# Patient Record
Sex: Male | Born: 1955 | Race: Asian | Hispanic: No | Marital: Single | Smoking: Former smoker
Health system: Southern US, Community
[De-identification: ages and names within clinical notes are randomized; demographics above are authoritative.]

## PROBLEM LIST (undated history)

## (undated) DIAGNOSIS — B181 Chronic viral hepatitis B without delta-agent: Secondary | ICD-10-CM

## (undated) HISTORY — DX: Chronic viral hepatitis B without delta-agent: B18.1

---

## 2005-07-19 ENCOUNTER — Encounter: Admission: RE | Admit: 2005-07-19 | Discharge: 2005-07-19 | Payer: Self-pay | Admitting: Neurological Surgery

## 2005-08-23 ENCOUNTER — Encounter: Admission: RE | Admit: 2005-08-23 | Discharge: 2005-08-23 | Payer: Self-pay | Admitting: Neurological Surgery

## 2008-04-24 ENCOUNTER — Ambulatory Visit: Payer: Self-pay | Admitting: Internal Medicine

## 2008-08-05 ENCOUNTER — Ambulatory Visit: Payer: Self-pay | Admitting: Internal Medicine

## 2009-07-25 HISTORY — PX: LUMBAR DISC SURGERY: SHX700

## 2009-07-25 HISTORY — PX: SPINE SURGERY: SHX786

## 2010-03-05 ENCOUNTER — Ambulatory Visit: Payer: Self-pay | Admitting: Internal Medicine

## 2010-08-06 ENCOUNTER — Ambulatory Visit
Admission: RE | Admit: 2010-08-06 | Discharge: 2010-08-06 | Payer: Self-pay | Source: Home / Self Care | Attending: Internal Medicine | Admitting: Internal Medicine

## 2010-11-02 ENCOUNTER — Ambulatory Visit (INDEPENDENT_AMBULATORY_CARE_PROVIDER_SITE_OTHER): Payer: BC Managed Care – PPO | Admitting: Internal Medicine

## 2010-11-02 DIAGNOSIS — J069 Acute upper respiratory infection, unspecified: Secondary | ICD-10-CM

## 2011-05-27 ENCOUNTER — Ambulatory Visit (INDEPENDENT_AMBULATORY_CARE_PROVIDER_SITE_OTHER): Payer: BC Managed Care – PPO | Admitting: Internal Medicine

## 2011-05-27 DIAGNOSIS — Z23 Encounter for immunization: Secondary | ICD-10-CM

## 2011-09-12 ENCOUNTER — Ambulatory Visit (INDEPENDENT_AMBULATORY_CARE_PROVIDER_SITE_OTHER): Payer: BC Managed Care – PPO | Admitting: Internal Medicine

## 2011-09-12 ENCOUNTER — Encounter: Payer: Self-pay | Admitting: Internal Medicine

## 2011-09-12 DIAGNOSIS — Z8619 Personal history of other infectious and parasitic diseases: Secondary | ICD-10-CM | POA: Insufficient documentation

## 2011-09-12 DIAGNOSIS — M549 Dorsalgia, unspecified: Secondary | ICD-10-CM

## 2011-09-12 NOTE — Patient Instructions (Signed)
Take Zithromax Z-Pak 2 tablets day one followed by 1 tablet days 2 through 5. Call if not better in one week. Schedule physical exam summer 2013

## 2011-09-12 NOTE — Progress Notes (Signed)
  Subjective:    Patient ID: Sergio Lawson, male    DOB: 19-Aug-1955, 56 y.o.   MRN: [id number]  HPI 56 year old [place] man  disabled due to back pain. 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 In 2006 had right leg radiculopathy. Subsequently was found to have a large ruptured disc at L4-L5. Underwent surgery in late 2006 for that. Subsequently had nerve root LOC right L5 in 2007. Was unable to return to work due to persistent pain.  History of chronic hepatitis B 1992 seen by Dr. Madilyn Fireman. Liver biopsy performed by Dr. Madilyn Fireman 1992. Pathology showed mild chronic active hepatitis. Liver enzymes improved. Dr. Madilyn Fireman thought possibly there was a component of alcoholic liver disease the patient denied that he drank alcohol. Out for interferon was offered but decision made not to use it. In 2006 he continued to have a hepatitis B core antibody which was positive, surface antibody negative, positive surface antigen. Liver functions were normal at that time. He's not had a physical exam since then.  In today with URI symptoms. Wife has had similar illness. Denies fever or chills. Some cough. Mostly runny nose and upper respiratory congestion.    Review of Systems     Objective:   Physical Exam HEENT exam: TMs and pharynx are clear, pharynx is slightly injected, neck is supple without significant adenopathy, chest clear        Assessment & Plan:  URI  Plan: Zithromax Z-Pak take 2 tablets by mouth day one followed by 1 tablet by mouth days 2 through 5  Just patient have physical examination in the next few months and address whether or not he continues to have chronic hepatitis B.

## 2015-05-01 ENCOUNTER — Ambulatory Visit (INDEPENDENT_AMBULATORY_CARE_PROVIDER_SITE_OTHER): Payer: No Typology Code available for payment source | Admitting: Internal Medicine

## 2015-05-01 VITALS — BP 120/82 | Temp 98.0°F | Ht 65.0 in

## 2015-05-01 DIAGNOSIS — Z23 Encounter for immunization: Secondary | ICD-10-CM | POA: Diagnosis not present

## 2015-07-28 ENCOUNTER — Ambulatory Visit (INDEPENDENT_AMBULATORY_CARE_PROVIDER_SITE_OTHER): Payer: No Typology Code available for payment source | Admitting: Internal Medicine

## 2015-07-28 ENCOUNTER — Encounter: Payer: Self-pay | Admitting: Internal Medicine

## 2015-07-28 VITALS — BP 120/70 | HR 75 | Temp 97.8°F | Wt 166.0 lb

## 2015-07-28 DIAGNOSIS — J069 Acute upper respiratory infection, unspecified: Secondary | ICD-10-CM | POA: Diagnosis not present

## 2015-07-28 DIAGNOSIS — H6092 Unspecified otitis externa, left ear: Secondary | ICD-10-CM

## 2015-07-28 MED ORDER — AZITHROMYCIN 250 MG PO TABS: ORAL_TABLET | ORAL | Status: DC

## 2015-07-28 MED ORDER — BENZONATATE 100 MG PO CAPS: 200.0000 mg | ORAL_CAPSULE | Freq: Three times a day (TID) | ORAL | Status: DC

## 2015-07-28 MED ORDER — NEOMYCIN-POLYMYXIN-HC 3.5-10000-1 OT SOLN: OTIC | Status: DC

## 2015-07-28 NOTE — Patient Instructions (Signed)
Take Zithromax Z pak as directed. Cortisporin 4 drops four times daily prn itching in ear canals. Tessalon perles 200 ng 3 times daily for cough Rest and drink plenty of fluids.

## 2015-07-28 NOTE — Progress Notes (Signed)
   Subjective:    Patient ID: Sergio Lawson, male    DOB: 10/15/1955, 60 y.o.   MRN: [id number]  HPI 2 day history of URI symptoms. No fever or shaking chills. No sore throat. Has some cough and congestion. Wife has had similar illness. No recent travel history. Also complaining of some left ear irritation from time to time.    Review of Systems     Objective:   Physical Exam  Skin warm and dry. Nodes none. Pharynx very slightly injected without exudate. TMs are chronically scarred. Left external ear canal is erythematous without exudate or drainage. Neck is supple. Chest clear to auscultation without rales or wheezing.      Assessment & Plan:  Acute URI  Left otitis externa  Plan: Zithromax Z-Pak take 2 tablets day one followed by 1 tablet days 2 through 5. Cortisporin otic suspension to use 4 drops in left ear canal 4 times a day as needed for irritation. Rest and drink plenty of fluids. Tessalon Perles 200 mg 3 times daily as needed for cough

## 2016-10-06 ENCOUNTER — Other Ambulatory Visit: Payer: Self-pay | Admitting: Internal Medicine

## 2016-10-07 ENCOUNTER — Encounter: Payer: Self-pay | Admitting: Internal Medicine

## 2017-07-17 ENCOUNTER — Ambulatory Visit (INDEPENDENT_AMBULATORY_CARE_PROVIDER_SITE_OTHER): Payer: Self-pay | Admitting: Internal Medicine

## 2017-07-17 ENCOUNTER — Encounter: Payer: Self-pay | Admitting: Internal Medicine

## 2017-07-17 VITALS — BP 136/90 | HR 76 | Temp 98.9°F | Wt 160.0 lb

## 2017-07-17 DIAGNOSIS — J069 Acute upper respiratory infection, unspecified: Secondary | ICD-10-CM

## 2017-07-17 MED ORDER — AZITHROMYCIN 250 MG PO TABS: ORAL_TABLET | ORAL | 0 refills | Status: DC

## 2017-07-17 MED ORDER — BENZONATATE 100 MG PO CAPS: 100.0000 mg | ORAL_CAPSULE | Freq: Three times a day (TID) | ORAL | 0 refills | Status: DC | PRN

## 2017-07-17 NOTE — Patient Instructions (Signed)
Tessalon Perles 100 mg 3 times daily as needed for cough.  Zithromax Z-Pak take 2 tablets day 1 followed by 1 tablet days 2 through 5.  Rest and drink plenty of fluids.

## 2017-07-17 NOTE — Progress Notes (Signed)
   Subjective:    Patient ID: Sergio Lawson, male    DOB: 06/23/1956, 61 y.o.   MRN: [id number]  HPI Here today for an acute visit.  Has had URI symptoms for about a week.  Has had cough and congestion.  No fever or chills.  [facility] couple of months ago.  No myalgias.  Throat is sore.    Review of Systems history of chronic back pain and is on Disability.  Pain is midline without radiation to legs     Objective:   Physical Exam  Skin warm and dry.  Nodes none.  Pharynx is injected without exudate.  TMs are slightly full but not red.  Neck is supple.  No adenopathy.  Chest clear to auscultation without rales or wheezing.      Assessment & Plan:  Acute URI  Plan: Zithromax Z-Pak take 2 tablets day 1 followed by 1 tablet days 2 through 5.  Tessalon Perles 100 mg 3 times daily as needed for cough.  Rest and drink plenty of fluids.

## 2018-09-28 ENCOUNTER — Telehealth: Payer: Self-pay

## 2018-09-28 ENCOUNTER — Ambulatory Visit (INDEPENDENT_AMBULATORY_CARE_PROVIDER_SITE_OTHER): Payer: Self-pay | Admitting: Internal Medicine

## 2018-09-28 ENCOUNTER — Other Ambulatory Visit: Payer: Self-pay

## 2018-09-28 VITALS — BP 102/80 | HR 78 | Temp 98.5°F

## 2018-09-28 DIAGNOSIS — H9203 Otalgia, bilateral: Secondary | ICD-10-CM

## 2018-09-28 DIAGNOSIS — H6503 Acute serous otitis media, bilateral: Secondary | ICD-10-CM

## 2018-09-28 DIAGNOSIS — Z87898 Personal history of other specified conditions: Secondary | ICD-10-CM

## 2018-09-28 DIAGNOSIS — R42 Dizziness and giddiness: Secondary | ICD-10-CM

## 2018-09-28 DIAGNOSIS — R112 Nausea with vomiting, unspecified: Secondary | ICD-10-CM

## 2018-09-28 LAB — POCT INFLUENZA A/B
INFLUENZA A, POC: NEGATIVE
INFLUENZA B, POC: NEGATIVE

## 2018-09-28 MED ORDER — AZITHROMYCIN 250 MG PO TABS: ORAL_TABLET | ORAL | 0 refills | Status: DC

## 2018-09-28 MED ORDER — ONDANSETRON HCL 4 MG/2ML IJ SOLN
4.0000 mg | Freq: Once | INTRAMUSCULAR | Status: AC
Start: 2018-09-28 — End: 2018-09-28
  Administered 2018-09-28: 4 mg via INTRAMUSCULAR

## 2018-09-28 MED ORDER — MECLIZINE HCL 25 MG PO TABS: 25.0000 mg | ORAL_TABLET | Freq: Three times a day (TID) | ORAL | 0 refills | Status: DC | PRN

## 2018-09-28 NOTE — Telephone Encounter (Signed)
Patient's daughter Vamsi Biter called patient has vertigo he is vomiting and she said he experiences this once a year. She wants to know if you could work him in today?

## 2018-09-28 NOTE — Progress Notes (Signed)
   Subjective:    Patient ID: Sergio Lawson, male    DOB: 07-31-1955, 63 y.o.   MRN: [id number]  HPI 63 year old [place] Male not seen here since December 2018 in today with complaint of dizziness.  Apparently visited [place] 4 months ago.  Has remote history of vertigo a number of years ago.  Has not had flulike symptoms.  No fever or shaking chills.  He is complaining of some ear pain and had vomiting with episode of dizziness.    Review of Systems     Objective:   Physical Exam Blood pressure 102/80.  Temperature 98.5 degrees pulse oximetry 98%.  Skin warm and dry.  Nodes none.  Neck is supple.  TMs are slightly full.  Chest clear to auscultation.  Cardiac exam regular rate and rhythm.  No focal deficits on brief neurological exam. Rapid flu test is negative      Assessment & Plan:  Serous otitis media bilateral  Benign positional vertigo  Plan: Zithromax Z-PAK take 2 tablets day 1 followed by 1 tablet days 2 through 5.  Meclizine 25 mg 3 times a day as needed for dizziness and nausea.  Zofran 4 mg IM given in office for nausea.

## 2018-09-28 NOTE — Telephone Encounter (Signed)
4;30 pm

## 2018-10-20 ENCOUNTER — Encounter: Payer: Self-pay | Admitting: Internal Medicine

## 2018-10-20 DIAGNOSIS — Z87898 Personal history of other specified conditions: Secondary | ICD-10-CM | POA: Insufficient documentation

## 2018-10-20 NOTE — Patient Instructions (Addendum)
Zofran IM given in office.  Rapid flu test is negative.  Take Zithromax Z-PAK 2 tablets day 1 followed by 1 tablet days 2 through 5.  Meclizine 25 mg 3 times a day as needed for dizziness and nausea.  Call if not better in 24 to 48 hours or sooner if worse.

## 2019-05-17 ENCOUNTER — Other Ambulatory Visit: Payer: Self-pay

## 2019-05-17 DIAGNOSIS — Z20822 Contact with and (suspected) exposure to covid-19: Secondary | ICD-10-CM

## 2019-05-18 LAB — NOVEL CORONAVIRUS, NAA: SARS-CoV-2, NAA: NOT DETECTED

## 2019-05-20 ENCOUNTER — Other Ambulatory Visit: Payer: Self-pay | Admitting: *Deleted

## 2019-05-20 DIAGNOSIS — Z20822 Contact with and (suspected) exposure to covid-19: Secondary | ICD-10-CM

## 2019-05-22 LAB — NOVEL CORONAVIRUS, NAA: SARS-CoV-2, NAA: NOT DETECTED

## 2021-04-08 ENCOUNTER — Telehealth: Payer: Self-pay

## 2021-04-08 NOTE — Telephone Encounter (Signed)
Patient called complaining of cough off and on for about a month.  Advised him to do a covid test.  He says he has done this in the past.  Denies fever, sore throat.  Has not taken any medication for this.  Appointment scheduled.

## 2021-04-09 ENCOUNTER — Ambulatory Visit (INDEPENDENT_AMBULATORY_CARE_PROVIDER_SITE_OTHER): Payer: Medicare Other | Admitting: Internal Medicine

## 2021-04-09 ENCOUNTER — Ambulatory Visit
Admission: RE | Admit: 2021-04-09 | Discharge: 2021-04-09 | Disposition: A | Discharge status: Home / Self Care | Payer: Medicare Other | Source: Ambulatory Visit | Attending: Internal Medicine | Admitting: Internal Medicine

## 2021-04-09 ENCOUNTER — Other Ambulatory Visit: Payer: Self-pay

## 2021-04-09 ENCOUNTER — Encounter: Payer: Self-pay | Admitting: Internal Medicine

## 2021-04-09 VITALS — BP 152/90 | HR 75 | Ht 64.0 in | Wt 170.0 lb

## 2021-04-09 DIAGNOSIS — J22 Unspecified acute lower respiratory infection: Secondary | ICD-10-CM | POA: Diagnosis not present

## 2021-04-09 MED ORDER — BENZONATATE 100 MG PO CAPS: 100.0000 mg | ORAL_CAPSULE | Freq: Three times a day (TID) | ORAL | 0 refills | Status: DC | PRN

## 2021-04-09 MED ORDER — AMOXICILLIN 500 MG PO CAPS: 500.0000 mg | ORAL_CAPSULE | Freq: Three times a day (TID) | ORAL | 0 refills | Status: AC

## 2021-04-09 NOTE — Patient Instructions (Addendum)
Take Amoxicillin 500 mg 3 times a day for 10 days.  Tessalon Perles 3 times daily cough.  Have CXR today.  Addendum: May have lingular pneumonia.  Will need follow-up in 2 weeks.  Message left on voicemail.

## 2021-04-09 NOTE — Progress Notes (Signed)
   Subjective:    Patient ID: Sergio Lawson, male    DOB: 1955-11-04, 65 y.o.   MRN: [id number]  HPI  65 year old [place] Male seen today with protracted cough. Did travel to the beach recently with daughter and grandchild from [place]. Last seen here in 2020.   Nonsmoker.  Also, traveled with his wife to [place] earlier this Summer.  Denies fever or shaking chills.   Records indicate he has had 3 COVID vaccines- the last one being November 2021.    Review of Systems- No weight loss, night sweats. No travel history overseas     Objective:   Physical Exam BP 152/90 ,pulse 75  regular,pulse oximetry 98%, weight 170 pounds, BMI 29.18  Skin: Warm and dry.  No cervical adenopathy.  Pharynx is injected without exudate.  TMs are clear.  Neck supple.  Chest clear to auscultation without rales or wheezing.       Assessment & Plan:   Acute lower respiratory infection with abnormal CXR- ? pneumonia  Plan: Amoxicillin 500 mg 3 times a day for 10 days.  Tessalon Perles 100 mg 3 times a day as needed for cough.  Have Chest x-ray. No other CXR on file in Epic.  Addendum: Chest x-ray shows coarsened interstitial markings.  No available x-ray for comparison.  No pleural effusion but  Radiologist notes opacity overlying right heart border corresponding to the lingula --?  Lingular pneumonia.  Patient was notified of abnormal chest x-ray and recommend follow-up in 2 weeks.  Message was left on his voicemail.  We will be certain to contact him again next week as well.

## 2021-04-26 ENCOUNTER — Ambulatory Visit (INDEPENDENT_AMBULATORY_CARE_PROVIDER_SITE_OTHER): Payer: Medicare Other | Admitting: Internal Medicine

## 2021-04-26 ENCOUNTER — Encounter: Payer: Self-pay | Admitting: Internal Medicine

## 2021-04-26 ENCOUNTER — Other Ambulatory Visit: Payer: Self-pay

## 2021-04-26 ENCOUNTER — Ambulatory Visit
Admission: RE | Admit: 2021-04-26 | Discharge: 2021-04-26 | Disposition: A | Discharge status: Home / Self Care | Payer: Medicare Other | Source: Ambulatory Visit | Attending: Internal Medicine | Admitting: Internal Medicine

## 2021-04-26 VITALS — BP 142/82 | HR 68 | Temp 98.1°F | Ht 64.0 in | Wt 169.0 lb

## 2021-04-26 DIAGNOSIS — R9389 Abnormal findings on diagnostic imaging of other specified body structures: Secondary | ICD-10-CM

## 2021-04-26 DIAGNOSIS — J9811 Atelectasis: Secondary | ICD-10-CM

## 2021-04-26 DIAGNOSIS — J189 Pneumonia, unspecified organism: Secondary | ICD-10-CM

## 2021-04-26 DIAGNOSIS — R059 Cough, unspecified: Secondary | ICD-10-CM | POA: Diagnosis not present

## 2021-04-26 DIAGNOSIS — Z87891 Personal history of nicotine dependence: Secondary | ICD-10-CM

## 2021-04-26 DIAGNOSIS — R918 Other nonspecific abnormal finding of lung field: Secondary | ICD-10-CM

## 2021-04-26 MED ORDER — AZITHROMYCIN 250 MG PO TABS: ORAL_TABLET | ORAL | 0 refills | Status: AC

## 2021-04-26 NOTE — Progress Notes (Signed)
Chest xray ordered prior to appointment.

## 2021-04-26 NOTE — Patient Instructions (Signed)
Have ordered chest CT with contrast as recommended by radiology for right perihilar abnormality.  He is a former smoker.  He will take Zithromax Z-PAK 2 tabs day 1 followed by 1 tab days 2 through 5.  He has no respiratory distress and his chest is clear.  He may need pulmonary consultation given his recent travel to [place].  Lung cancer needs to be excluded.

## 2021-04-26 NOTE — Progress Notes (Signed)
Subjective:    Patient ID: Sergio Lawson, male    DOB: 1955/11/30, 65 y.o.   MRN: [id number]  HPI 65 year old Male for follow up of abnormal CXR. He says he has less cough after taking Amoxicillin. [facility] He does not have card with him today.  He was here in September for history of cough for several weeks.  He and his wife had traveled to [place] earlier this summer and subsequently to the beach with her daughter and grandchild visiting from [place].  When I saw him in September 2022, it was his first visit here since March 2020.  Patient denied weight loss, night sweats.  He is seen here infrequently.  Before his visit in September, he was last seen here in March 2020.  He would have occasional visits for URI.  There is a remote history of vertigo.  Chest x-ray was done and was abnormal showing a vague opacity overlying the right heart border corresponding to the lingula on lateral view.  He had no pleural effusion and no pneumothorax.  He had coarsened interstitial markings.  Radiologist thought there was questionable airspace disease of the lingula potentially pneumonia.  Patient was treated with Amoxicillin.  He says cough is better.  Not completely resolved.  He is now retired.  Wife is not ill.  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  In 2006 he had right leg radiculopathy and was found to have a large ruptured disc L4-L5 and underwent surgery in 2006 for that.  Dr. Danielle Dess ordered it.  He never returned to work after that due to persistent back pain and radiculopathy.  History of chronic Hepatitis B in 1992 seen by Dr. Madilyn Fireman and had liver biopsy showing mild chronic active hepatitis.  Liver enzymes improved and Dr. Madilyn Fireman thought he possibly had a component of alcoholic liver disease but the patient denied that he drank alcohol.  Interferon was offered but patient declined.  Social history: He is married.  Has 2 adult daughters.   Wife is retired but formerly worked in a Veterinary surgeon.  They were sponsored by [employer] and moved here from [place] in the 1980s.  They still have family in [place].    Review of Systems History of smoking but quit 2011. Used to smoke about a half pack a day for some 20 years.     Objective:   Physical Exam Blood pressure 142/82 pulse 68 temperature 98.1 degrees pulse oximetry 98% weight 169 pounds BMI 29.39.  He weighed 170 pounds in the last visit in mid September.  Skin: Warm and dry.  No cervical adenopathy.  No thyromegaly.  Chest is clear to auscultation without rales or wheezing.       Assessment & Plan:  Persistent cough but patient says it is better  Plan: He is to have repeat chest x-ray with further instructions to follow.  Prescription sent for Zithromax Z-PAK.  Addendum: He has ill-defined perihilar opacity in the right midlung zone with evidence of persistent right middle lobe atelectasis on the lateral projection.  Left lung is clear.  No pleural effusions.  No pneumothorax.  No pulmonary edema.  Heart size is normal.  Patient was contacted regarding persistent abnormal chest x-ray.  Radiologist is recommended CT with contrast and this will be ordered.  He will likely need Pulmonary consultation.  He was given Zithromax Z-PAK 2 tabs day 1 followed by 1 tab days 2 through  5.  Depending on what we find on CT, he may need further lab studies.

## 2021-04-27 ENCOUNTER — Other Ambulatory Visit: Payer: Medicare Other | Admitting: Internal Medicine

## 2021-04-27 DIAGNOSIS — Z1329 Encounter for screening for other suspected endocrine disorder: Secondary | ICD-10-CM

## 2021-04-27 DIAGNOSIS — Z125 Encounter for screening for malignant neoplasm of prostate: Secondary | ICD-10-CM

## 2021-04-27 DIAGNOSIS — J189 Pneumonia, unspecified organism: Secondary | ICD-10-CM

## 2021-04-27 DIAGNOSIS — R9389 Abnormal findings on diagnostic imaging of other specified body structures: Secondary | ICD-10-CM

## 2021-04-27 DIAGNOSIS — R948 Abnormal results of function studies of other organs and systems: Secondary | ICD-10-CM

## 2021-04-27 DIAGNOSIS — R700 Elevated erythrocyte sedimentation rate: Secondary | ICD-10-CM

## 2021-04-28 ENCOUNTER — Ambulatory Visit
Admission: RE | Admit: 2021-04-28 | Discharge: 2021-04-28 | Disposition: A | Discharge status: Home / Self Care | Payer: Medicare Other | Source: Ambulatory Visit | Attending: Internal Medicine | Admitting: Internal Medicine

## 2021-04-28 DIAGNOSIS — R918 Other nonspecific abnormal finding of lung field: Secondary | ICD-10-CM

## 2021-04-28 DIAGNOSIS — J9811 Atelectasis: Secondary | ICD-10-CM

## 2021-04-28 LAB — COMPLETE METABOLIC PANEL WITH GFR
AG Ratio: 1.1 (calc) (ref 1.0–2.5)
ALT: 17 U/L (ref 9–46)
AST: 41 U/L — ABNORMAL HIGH (ref 10–35)
Albumin: 4 g/dL (ref 3.6–5.1)
Alkaline phosphatase (APISO): 90 U/L (ref 35–144)
BUN: 12 mg/dL (ref 7–25)
CO2: 29 mmol/L (ref 20–32)
Calcium: 9.1 mg/dL (ref 8.6–10.3)
Chloride: 104 mmol/L (ref 98–110)
Creat: 0.88 mg/dL (ref 0.70–1.35)
Globulin: 3.8 g/dL (calc) — ABNORMAL HIGH (ref 1.9–3.7)
Glucose, Bld: 107 mg/dL (ref 65–139)
Potassium: 4.3 mmol/L (ref 3.5–5.3)
Sodium: 140 mmol/L (ref 135–146)
Total Bilirubin: 0.6 mg/dL (ref 0.2–1.2)
Total Protein: 7.8 g/dL (ref 6.1–8.1)
eGFR: 95 mL/min/{1.73_m2} (ref >=60)

## 2021-04-28 LAB — CBC WITH DIFFERENTIAL/PLATELET
Absolute Monocytes: 501 cells/uL (ref 200–950)
Basophils Absolute: 28 cells/uL (ref 0–200)
Basophils Relative: 0.5 %
Eosinophils Absolute: 209 cells/uL (ref 15–500)
Eosinophils Relative: 3.8 %
HCT: 44.8 % (ref 38.5–50.0)
Hemoglobin: 14.1 g/dL (ref 13.2–17.1)
Lymphs Abs: 1711 cells/uL (ref 850–3900)
MCH: 25.3 pg — ABNORMAL LOW (ref 27.0–33.0)
MCHC: 31.5 g/dL — ABNORMAL LOW (ref 32.0–36.0)
MCV: 80.3 fL (ref 80.0–100.0)
MPV: 10.6 fL (ref 7.5–12.5)
Monocytes Relative: 9.1 %
Neutro Abs: 3053 cells/uL (ref 1500–7800)
Neutrophils Relative %: 55.5 %
Platelets: 197 10*3/uL (ref 140–400)
RBC: 5.58 10*6/uL (ref 4.20–5.80)
RDW: 15.2 % — ABNORMAL HIGH (ref 11.0–15.0)
Total Lymphocyte: 31.1 %
WBC: 5.5 10*3/uL (ref 3.8–10.8)

## 2021-04-28 LAB — SEDIMENTATION RATE: Sed Rate: 11 mm/h (ref 0–20)

## 2021-04-28 LAB — PSA: PSA: 1.68 ng/mL (ref <=4.00)

## 2021-04-28 LAB — TSH: TSH: 1.55 mIU/L (ref 0.40–4.50)

## 2021-04-28 MED ORDER — IOPAMIDOL (ISOVUE-300) INJECTION 61%
75.0000 mL | Freq: Once | INTRAVENOUS | Status: AC | PRN
  Administered 2021-04-28: 75 mL via INTRAVENOUS

## 2021-04-30 ENCOUNTER — Telehealth: Payer: Self-pay

## 2021-04-30 NOTE — Telephone Encounter (Signed)
[facility] called with results of Chest CT:  IMPRESSION: There appears to be occlusion or compression of the right middle lobe bronchus secondary to probable right hilar mass or possibly endobronchial lesion concerning for malignancy. This results in atelectasis of the right middle lobe. There is also noted mildly enlarged right hilar, subcarinal and precarinal adenopathy concerning for metastatic disease. Bronchoscopy is recommended for further evaluation. These results will be called to the ordering clinician or representative by the Radiologist Assistant, and communication documented in the PACS or zVision Dashboard.

## 2021-05-03 NOTE — Telephone Encounter (Signed)
Patient aware.

## 2021-05-12 NOTE — Progress Notes (Signed)
Synopsis: Referred for right hilar mass, mediastinal LAD by Margaree Mackintosh, MD  Subjective:   PATIENT ID: Sergio Lawson GENDER: male DOB: 1956-02-24, MRN: [id number]  Chief Complaint  Patient presents with   Consult    Coughing  that has been coming and going.  He is here after having the CT scan and it showing a growth   65yM with history of HBV, smoking who is referred for R hilar mass and mediastinal LAD.  He does have a cough over last several months. but his PCP has prescribed something that has helped. No hemoptysis. No CP. He does have DOE over last 4-5 months. He has no fever. No weight loss. No night sweats  except during summer time.  Can walk up a couple flights of stairs without stopping due to DOE.  Otherwise pertinent review of systems is negative.  No family history of lung disease or lung cancer.  He smoked 20 years half ppd, quit in 2011. He works for [employer] He is not exposed to any dusts or pariculates without a mask. Lives in [place]. From [place], has lived here in [place] since 1983. Last year went back to [place], 3 months ago to [place].   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HCC)      Family History  Problem Relation Age of Onset   Cancer Mother      Past Surgical History:  Procedure Laterality Date   SPINE SURGERY  2011   herniated disc    Social History   Socioeconomic History   Marital status: Single    Spouse name: Not on file   Number of children: Not on file   Years of education: Not on file   Highest education level: Not on file  Occupational History   Not on file  Tobacco Use   Smoking status: Former    Types: Cigarettes    Quit date: 07/25/2009    Years since quitting: 11.8   Smokeless tobacco: Never  Substance and Sexual Activity   Alcohol use: No   Drug use: Not on file   Sexual activity: Not on file  Other Topics Concern   Not on file  Social History Narrative   Not on file   Social  Determinants of Health   Financial Resource Strain: Not on file  Food Insecurity: Not on file  Transportation Needs: Not on file  Physical Activity: Not on file  Stress: Not on file  Social Connections: Not on file  Intimate Partner Violence: Not on file     Allergies  Allergen Reactions   Codeine Nausea Only   Levofloxacin Nausea Only     No outpatient medications prior to visit.   No facility-administered medications prior to visit.       Objective:   Physical Exam:  General appearance: 65 y.o., male, NAD, conversant  Eyes: anicteric sclerae, moist conjunctivae; no lid-lag; PERRL, tracking appropriately HENT: NCAT; oropharynx, MMM, no mucosal ulcerations; normal hard and soft palate Neck: Trachea midline; no lymphadenopathy, no JVD Lungs: CTAB, no crackles, no wheeze, with normal respiratory effort CV: RRR, no MRGs  Abdomen: Soft, non-tender; non-distended, BS present  Extremities: No peripheral edema, radial and DP pulses present bilaterally  Skin: Normal temperature, turgor and texture; no rash Psych: Appropriate affect Neuro: Alert and oriented to person and place, no focal deficit    Vitals:   05/13/21 1323  BP: 140/72  Pulse: 70  Temp: 98 F (  36.7 C)  TempSrc: Oral  SpO2: 99%  Weight: 174 lb 6 oz (79.1 kg)  Height: 5\' 4"  (1.626 m)   99% on RA BMI Readings from Last 3 Encounters:  05/13/21 29.93 kg/m  04/26/21 29.01 kg/m  04/09/21 29.18 kg/m   Wt Readings from Last 3 Encounters:  05/13/21 174 lb 6 oz (79.1 kg)  04/26/21 169 lb (76.7 kg)  04/09/21 170 lb (77.1 kg)     CBC    Component Value Date/Time   WBC 5.5 04/27/2021 1253   RBC 5.58 04/27/2021 1253   HGB 14.1 04/27/2021 1253   HCT 44.8 04/27/2021 1253   PLT 197 04/27/2021 1253   MCV 80.3 04/27/2021 1253   MCH 25.3 (L) 04/27/2021 1253   MCHC 31.5 (L) 04/27/2021 1253   RDW 15.2 (H) 04/27/2021 1253   LYMPHSABS 1,711 04/27/2021 1253   EOSABS 209 04/27/2021 1253   BASOSABS 28  04/27/2021 1253      Chest Imaging: CT Chest 04/30/21 reviewed by me and remarkable for right hilar mass causing atelectasis of RML and inferior portion of RUL, mediastinal LAD  Pulmonary Functions Testing Results: No flowsheet data found.  None available      Assessment & Plan:   # Right hilar mass # Mediastinal LAD  # Cough # History of smoking  Plan: - staging EBUS left to right under general anesthesia, airway inspection - PFTs   RTC 4 weeks to discuss results   06/30/21, MD [facility] 05/13/2021 1:36 PM

## 2021-05-13 ENCOUNTER — Other Ambulatory Visit: Payer: Self-pay

## 2021-05-13 ENCOUNTER — Ambulatory Visit (INDEPENDENT_AMBULATORY_CARE_PROVIDER_SITE_OTHER): Payer: Medicare Other | Admitting: Student

## 2021-05-13 ENCOUNTER — Telehealth: Payer: Self-pay | Admitting: Student

## 2021-05-13 ENCOUNTER — Encounter: Payer: Self-pay | Admitting: Student

## 2021-05-13 VITALS — BP 140/72 | HR 70 | Temp 98.0°F | Ht 64.0 in | Wt 174.4 lb

## 2021-05-13 DIAGNOSIS — F17200 Nicotine dependence, unspecified, uncomplicated: Secondary | ICD-10-CM | POA: Diagnosis not present

## 2021-05-13 DIAGNOSIS — R918 Other nonspecific abnormal finding of lung field: Secondary | ICD-10-CM | POA: Diagnosis not present

## 2021-05-13 DIAGNOSIS — R590 Localized enlarged lymph nodes: Secondary | ICD-10-CM

## 2021-05-13 NOTE — Telephone Encounter (Signed)
Per Florentina Addison Summer(pre admit testing) via epic secure chat-- there is no availability for pre admit testing tomorrow. Patient will need to arrive at same day surgery at 5:00a on 05/17/2021. NPO after 12:00a.  Covid test 05/14/2021.  Patient is aware and voiced his understanding.  Nothing further needed.

## 2021-05-13 NOTE — Patient Instructions (Addendum)
-   We will work on scheduling you for bronchoscopy with endobronchial ultrasound to sample right lung mass and lymph nodes (EBUS) - Nothing to eat or drink after midnight the night before the procedure - I have also ordered PFTs (breathing tests) which we can schedule in the next 1-2 weeks

## 2021-05-13 NOTE — Telephone Encounter (Signed)
Patient only has Medicare A & B Prior Auth Not Required

## 2021-05-13 NOTE — Telephone Encounter (Signed)
EBUS scheduled for [facility] 05/17/2021 at 7:00a. TK:KOEC mass [id number], [id number]  PCC's please see bronch info.

## 2021-05-14 ENCOUNTER — Other Ambulatory Visit: Payer: Self-pay

## 2021-05-14 ENCOUNTER — Other Ambulatory Visit: Payer: Self-pay | Admitting: Pulmonary Disease

## 2021-05-14 ENCOUNTER — Telehealth: Payer: Self-pay | Admitting: *Deleted

## 2021-05-14 ENCOUNTER — Encounter (HOSPITAL_COMMUNITY): Payer: Self-pay | Admitting: Pulmonary Disease

## 2021-05-14 ENCOUNTER — Telehealth: Payer: Self-pay

## 2021-05-14 NOTE — Telephone Encounter (Signed)
I called the pt and there was no answer- LMTCB. He will need to go for the covid testing today. Will forward to triage basket as I am leaving early today and this will need to be f/u on. Thanks.

## 2021-05-14 NOTE — Telephone Encounter (Signed)
I will close

## 2021-05-14 NOTE — Telephone Encounter (Signed)
-----   Message from Omar Person, MD sent at 05/13/2021  2:02 PM EDT ----- Regarding: FW: EBUS right hilar mass, mediastinal LAD  ----- Message ----- From: Omar Person, MD Sent: 05/13/2021   1:55 PM EDT To: Marcellus Scott, CMA, [facility] P[facility] Pool Subject: EBUS right hilar mass, mediastinal LAD         Has right hilar mass, LAD. Needs EBUS. Any way that we could do it tomorrow morning at either [place] or [facility]? I've got clinic starting at 1:30. Other dates would be 11/2, 11/3, 11/4. Would have to be at [place] if 11/3 or 11/4.   Please schedule the following:  Provider performing procedure:Nathan Meier Diagnosis: Right hilar mass, mediastinal lymphadenopathy Which side if for nodule / mass? right Procedure: EBUS, video bronchoscopy  Has patient been spoken to by Provider and given informed consent? yes Anesthesia: yes Do you need Fluro? No Duration of procedure: 1 hour Date: 05/14/21 preferred Alternate Date: 11/2, 11/3, 11/4. Would have to be at [place] if 11/3 or 11/4.  Time: AM if 05/14/21 otherwise doesn't matter Location: [facility] or [facility]. Would have to be at [place] if 11/3 or 11/4.  Does patient have OSA? no DM? no Or Latex allergy? no Medication Restriction: none Anticoagulate/Antiplatelet: none Pre-op Labs Ordered:determined by Anesthesia Imaging request: none  (If, SuperDimension CT Chest, please have STAT courier sent to ENDO)  Please coordinate Pre-op COVID Testing     Thanks! Nate

## 2021-05-14 NOTE — Telephone Encounter (Signed)
Patient is aware that EBUS will be rescheduled.  Currently working with RT and OR to reschedule.  Patient is aware that I will contact him once rescheduled.

## 2021-05-14 NOTE — Telephone Encounter (Signed)
----- Message from Chilton Greathouse, MD sent at 05/14/2021  9:55 AM EDT ----- Regarding: RE: EBUS Thanks All. Looks like [facility] OR is also full.   There is an opening on 24th at [facility] for 11 am. Either me or Jesusita Oka will be able to do it. Verlon Au- Can you call patient and tell him to arrive 2.5 hrs before, NPO before midnight and arrange pre covid testing  Praveen   ----- Message ----- From: Erin Fulling, MD Sent: 05/14/2021   9:55 AM EDT To: Omar Person, MD, Chilton Greathouse, MD, # Subject: RE: EBUS                                       I can oblige the patient and be available 10/28 for 7AM Bronch/EBUS. if everyone is OK with that. ----- Message ----- From: Omar Person, MD Sent: 05/14/2021   9:45 AM EDT To: Erin Fulling, MD, Chilton Greathouse, MD, # Subject: RE: EBUS                                       Not sure if it's a good idea for me to do it that morning since I'm in clinic starting at 9 in [place] on 10/28. Starlyn Skeans - if you guys (and/or Jesusita Oka) can't find a time that works for you I could try to do it that morning - I'd just hate to rush it before clinic and not be available if there was complication or delay.     ----- Message ----- From: Rosaland Lao, CMA Sent: 05/14/2021   9:39 AM EDT To: Erin Fulling, MD, Omar Person, MD, # Subject: RE: EBUS                                       Dr. Thora Lance,   If I can get the 28th at 7:00 approved, would that work with your schedule? ----- Message ----- From: Erin Fulling, MD Sent: 05/14/2021   8:56 AM EDT To: Omar Person, MD, Chilton Greathouse, MD, # Subject: RE: EBUS                                       Eloisa Northern,   We need to cancel for Monday, but we also need to reschedule for next week in afternoons sometime, I think that we should be able to open Tuesday and Thursday to oblige Dr Thora Lance and the patient. Patient needs diagnosis ASAP! ----- Message ----- From: Rosaland Lao, CMA Sent: 05/14/2021    8:40 AM EDT To: Erin Fulling, MD, Omar Person, MD, # Subject: RE: EBUS                                       Thank you.   Should I go ahead and cancel for Monday? ----- Message ----- From: Chilton Greathouse, MD Sent: 05/14/2021   8:18 AM EDT To: Erin Fulling, MD, Omar Person, MD, # Subject: RE: EBUS  Thanks for checking Margie. Adding Willow Ora to see what other options are there. ----- Message ----- From: Rosaland Lao, CMA Sent: 05/14/2021   8:06 AM EDT To: Omar Person, MD, Chilton Greathouse, MD Subject: RE: EBUS                                       Hi,  You are very welcome! We only have OR time on Monday, Wednesday and Friday. Next week all three days are booked.   ----- Message ----- From: Omar Person, MD Sent: 05/13/2021   5:52 PM EDT To: Chilton Greathouse, MD, Rosaland Lao, CMA Subject: EBUS                                           Thanks for working hard to help schedule this. I spoke with Dr. Isaiah Serge though and especially since it's my first day at [facility] covering the ICU there on 10/24, it's probably not an ideal time to do an EBUS at 7am. Is there any time available there 10/26 or 10/27 (I'm doing a 24h shift on 10/24-10/25 and won't be there on 10/25 during the day)? If not, is there any time in the afternoon on 10/24?  Much appreciated!  Nate

## 2021-05-14 NOTE — Telephone Encounter (Signed)
Please see phone note from [place] for 05/14/21 I will close that note so there are not multiples open

## 2021-05-14 NOTE — Telephone Encounter (Signed)
Please see 05/14/2021 phone note.

## 2021-05-14 NOTE — Progress Notes (Signed)
Mr Ewing denies chest pain, patient is short of breath- this is what took patient to his PCP,  Dr.Mary Veatrice Kells. Patient denies having any s/s of Covid in his household.  Patient denies any known exposure to Covid.   I instructed patient to shower with antibiotic soap, if it is available.  Dry off with a clean towel. Do not put lotion, powder, cologne or deodorant or makeup.No jewelry or piercings. Men may shave their face and neck. Woman should not shave. No nail polish, artificial or acrylic nails. Wear clean clothes, brush your teeth. Glasses, contact lens,dentures or partials may not be worn in the OR. If you need to wear them, please bring a case for glasses, do not wear contacts or bring a case, the hospital does not have contact cases, dentures or partials will have to be removed , make sure they are clean, we will provide a denture cup to put them in. You will need some one to drive you home and a responsible person over the age of 84 to stay with you for the first 24 hours after surgery.

## 2021-05-14 NOTE — Telephone Encounter (Signed)
NM already sent referral to [facility] for this  Will forward this to them and procedure pool to f/u on Thanks

## 2021-05-14 NOTE — Telephone Encounter (Signed)
There is another phone encounter from [facility] in pt's chart dated today. Johny Drilling, please refer to that phone encounter. I have posted last message from [place] below.  Rosaland Lao, CMA     9:24 AM Note Patient is aware that EBUS will be rescheduled.  Currently working with RT and OR to reschedule.  Patient is aware that I will contact him once rescheduled.

## 2021-05-14 NOTE — Telephone Encounter (Signed)
I have contacted the patient he is aware of new appt will get his covid test today I have also spoke to his daughter and she is aware I have also sent a letter to his mychart to give him the information.

## 2021-05-14 NOTE — Telephone Encounter (Signed)
-----   Message from Erin Fulling, MD sent at 05/14/2021  8:56 AM EDT ----- Regarding: RE: EBUS Hi Carline Dura,   We need to cancel for Monday, but we also need to reschedule for next week in afternoons sometime, I think that we should be able to open Tuesday and Thursday to oblige Dr Thora Lance and the patient. Patient needs diagnosis ASAP! ----- Message ----- From: Rosaland Lao, CMA Sent: 05/14/2021   8:40 AM EDT To: Erin Fulling, MD, Omar Person, MD, # Subject: RE: EBUS                                       Thank you.   Should I go ahead and cancel for Monday? ----- Message ----- From: Chilton Greathouse, MD Sent: 05/14/2021   8:18 AM EDT To: Erin Fulling, MD, Omar Person, MD, # Subject: RE: EBUS                                       Thanks for checking [facility]. Adding Willow Ora to see what other options are there. ----- Message ----- From: Rosaland Lao, CMA Sent: 05/14/2021   8:06 AM EDT To: Omar Person, MD, Chilton Greathouse, MD Subject: RE: EBUS                                       Hi,  You are very welcome! We only have OR time on Monday, Wednesday and Friday. Next week all three days are booked.   ----- Message ----- From: Omar Person, MD Sent: 05/13/2021   5:52 PM EDT To: Chilton Greathouse, MD, Rosaland Lao, CMA Subject: EBUS                                           Thanks for working hard to help schedule this. I spoke with Dr. Isaiah Serge though and especially since it's my first day at [facility] covering the ICU there on 10/24, it's probably not an ideal time to do an EBUS at 7am. Is there any time available there 10/26 or 10/27 (I'm doing a 24h shift on 10/24-10/25 and won't be there on 10/25 during the day)? If not, is there any time in the afternoon on 10/24?  Much appreciated!  Nate

## 2021-05-14 NOTE — Telephone Encounter (Signed)
Ok im very confused on what is happening with this EBUS it was in [place] but now looks like its in [place] with Dr Sergio Lawson is the patient aware of this Sergio Lawson was helping me with the scheduling in [facility] yesterday but its changed can someone please let me know what is going on and if I need to contact the patient.

## 2021-05-15 LAB — SARS CORONAVIRUS 2 (TAT 6-24 HRS): SARS Coronavirus 2: NEGATIVE

## 2021-05-17 ENCOUNTER — Ambulatory Visit (HOSPITAL_COMMUNITY): Payer: Medicare Other | Admitting: Anesthesiology

## 2021-05-17 ENCOUNTER — Ambulatory Visit: Admission: RE | Admit: 2021-05-17 | Payer: Medicare Other | Source: Ambulatory Visit | Admitting: Student

## 2021-05-17 ENCOUNTER — Encounter (HOSPITAL_COMMUNITY)
Admission: RE | Disposition: A | Discharge status: Home / Self Care | Payer: Self-pay | Source: Ambulatory Visit | Attending: Pulmonary Disease

## 2021-05-17 ENCOUNTER — Ambulatory Visit (HOSPITAL_COMMUNITY): Payer: Medicare Other

## 2021-05-17 ENCOUNTER — Other Ambulatory Visit: Payer: Self-pay

## 2021-05-17 ENCOUNTER — Ambulatory Visit (HOSPITAL_COMMUNITY)
Admission: RE | Admit: 2021-05-17 | Discharge: 2021-05-17 | Disposition: A | Discharge status: Home / Self Care | Payer: Medicare Other | Source: Ambulatory Visit | Attending: Pulmonary Disease | Admitting: Pulmonary Disease

## 2021-05-17 ENCOUNTER — Encounter (HOSPITAL_COMMUNITY): Payer: Self-pay | Admitting: Pulmonary Disease

## 2021-05-17 ENCOUNTER — Encounter: Admission: RE | Payer: Self-pay | Source: Ambulatory Visit

## 2021-05-17 DIAGNOSIS — R918 Other nonspecific abnormal finding of lung field: Secondary | ICD-10-CM

## 2021-05-17 DIAGNOSIS — R590 Localized enlarged lymph nodes: Secondary | ICD-10-CM | POA: Insufficient documentation

## 2021-05-17 DIAGNOSIS — Z87891 Personal history of nicotine dependence: Secondary | ICD-10-CM | POA: Insufficient documentation

## 2021-05-17 DIAGNOSIS — Z9889 Other specified postprocedural states: Secondary | ICD-10-CM

## 2021-05-17 HISTORY — PX: BRONCHIAL BRUSHINGS: SHX5108

## 2021-05-17 HISTORY — PX: VIDEO BRONCHOSCOPY WITH ENDOBRONCHIAL ULTRASOUND: SHX6177

## 2021-05-17 HISTORY — PX: BRONCHIAL BIOPSY: SHX5109

## 2021-05-17 HISTORY — PX: FINE NEEDLE ASPIRATION: SHX5430

## 2021-05-17 HISTORY — PX: BRONCHIAL WASHINGS: SHX5105

## 2021-05-17 SURGERY — BRONCHOSCOPY, WITH EBUS
Anesthesia: General

## 2021-05-17 SURGERY — VIDEO BRONCHOSCOPY WITH ENDOBRONCHIAL NAVIGATION
Anesthesia: General

## 2021-05-17 MED ORDER — LIDOCAINE 2% (20 MG/ML) 5 ML SYRINGE
INTRAMUSCULAR | Status: DC | PRN
  Administered 2021-05-17: 60 mg via INTRAVENOUS

## 2021-05-17 MED ORDER — PROPOFOL 10 MG/ML IV BOLUS
INTRAVENOUS | Status: DC | PRN
Start: 2021-05-17 — End: 2021-05-17
  Administered 2021-05-17: 150 mg via INTRAVENOUS

## 2021-05-17 MED ORDER — PHENYLEPHRINE 40 MCG/ML (10ML) SYRINGE FOR IV PUSH (FOR BLOOD PRESSURE SUPPORT)
PREFILLED_SYRINGE | INTRAVENOUS | Status: DC | PRN
  Administered 2021-05-17 (×4): 80 ug via INTRAVENOUS
  Administered 2021-05-17: 40 ug via INTRAVENOUS
  Administered 2021-05-17: 80 ug via INTRAVENOUS
  Administered 2021-05-17: 40 ug via INTRAVENOUS
  Administered 2021-05-17: 80 ug via INTRAVENOUS

## 2021-05-17 MED ORDER — ONDANSETRON HCL 4 MG/2ML IJ SOLN
INTRAMUSCULAR | Status: DC | PRN
  Administered 2021-05-17: 4 mg via INTRAVENOUS

## 2021-05-17 MED ORDER — ROCURONIUM BROMIDE 10 MG/ML (PF) SYRINGE
PREFILLED_SYRINGE | INTRAVENOUS | Status: DC | PRN
  Administered 2021-05-17: 60 mg via INTRAVENOUS
  Administered 2021-05-17: 20 mg via INTRAVENOUS

## 2021-05-17 MED ORDER — DEXAMETHASONE SODIUM PHOSPHATE 10 MG/ML IJ SOLN
INTRAMUSCULAR | Status: DC | PRN
  Administered 2021-05-17: 4 mg via INTRAVENOUS

## 2021-05-17 MED ORDER — CHLORHEXIDINE GLUCONATE 0.12 % MT SOLN
15.0000 mL | Freq: Once | OROMUCOSAL | Status: AC
  Administered 2021-05-17: 15 mL via OROMUCOSAL
  Filled 2021-05-17 (×2): qty 15

## 2021-05-17 MED ORDER — PHENYLEPHRINE HCL-NACL 20-0.9 MG/250ML-% IV SOLN
INTRAVENOUS | Status: DC | PRN
  Administered 2021-05-17: 30 ug/min via INTRAVENOUS

## 2021-05-17 MED ORDER — FENTANYL CITRATE (PF) 100 MCG/2ML IJ SOLN
INTRAMUSCULAR | Status: DC | PRN
  Administered 2021-05-17 (×2): 50 ug via INTRAVENOUS

## 2021-05-17 MED ORDER — LACTATED RINGERS IV SOLN: INTRAVENOUS | Status: DC

## 2021-05-17 MED ORDER — SUGAMMADEX SODIUM 200 MG/2ML IV SOLN
INTRAVENOUS | Status: DC | PRN
  Administered 2021-05-17: 200 mg via INTRAVENOUS

## 2021-05-17 NOTE — Procedures (Addendum)
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   Estimated Blood Loss: Minimal   Complications: None apparent   Indications and History: Sergio Lawson is a  65 y.o. male with hx of hilar mass and mediastinal LAD. Recommendation made to achieve tissue sampling via endobronchial ultrasound guided nodal biopsies. The risks, benefits, complications, treatment options and expected outcomes were discussed with the patient.  The possibilities of pneumothorax, pneumonia, reaction to medication, pulmonary aspiration, perforation of a viscus, bleeding, failure to diagnose a condition and creating a complication requiring transfusion or operation were discussed with the patient who freely signed the consent.     Description of Procedure: The patient was examined in the preoperative area and history and data from the preprocedure consultation were reviewed. It was deemed appropriate to proceed.  The patient was taken to endo, identified as Sergio Lawson and the procedure verified as Flexible Video Fiberoptic Bronchoscopy.  A Time Out was held and the above information confirmed. After being taken to the operating room general anesthesia was initiated and the patient  was orally intubated. The video fiberoptic bronchoscope was introduced via the endotracheal tube and a general inspection was performed which showed narrowed right middle lobe entrance with extrinsic compression and dark mucosa in the right middle lobe bronchus.  The standard scope was then withdrawn and the endobronchial ultrasound was used to identify and characterize the peritracheal, hilar and bronchial lymph nodes. Inspection showed enlargement of station 7  node. Using real-time ultrasound guidance Wang needle biopsies were take from Station 7 node and hilar mass and were sent for cytology and tissue culture.   The standard scope was reintroduced and BAL performed in the right middle lobe, in addition brushings and endobronchial biopsies were performed in the right middle lobe plus bronchial washings.  These were sent for cytology, cultures  At the end of the procedure a general airway inspection was performed and there was no evidence of active bleeding. The bronchoscope was removed.  The patient tolerated the procedure well. There was no significant blood loss and there were no obvious complications. A post-procedural chest x-ray is pending..  The patient tolerated the procedure well without apparent complications. There was no significant blood loss. The bronchoscope was withdrawn. Anesthesia was reversed and the patient was taken to the PACU for recovery.    Samples: 1.  Wang needle biopsies from 7 node 2.  Wang needle biopsies from hilar mass 3.  Bronchoalveolar lavage from right middle lobe 4.  Bronchial brushing from right middle lobe 5.  Endobronchial biopsies from right middle lobe 6.  Bronchial washing from right lung  Plans:  The patient will be discharged from the PACU to home when recovered from anesthesia and after chest x-ray is reviewed. We will review the cytology, pathology and microbiology results with the patient when they become available. Outpatient followup will be with Dr Verlee Monte.  Marshell Garfinkel MD [facility] See Amion for pager  If no response to pager , please call [phone] until 7pm After 7:00 pm call Elink  [phone] 05/17/2021, 5:18 PM

## 2021-05-17 NOTE — Anesthesia Preprocedure Evaluation (Addendum)
Anesthesia Evaluation  Patient identified by MRN, date of birth, ID band Patient awake    Reviewed: Allergy & Precautions, NPO status , Patient's Chart, lab work & pertinent test results  Airway Mallampati: III  TM Distance: >3 FB Neck ROM: Full  Mouth opening: Limited Mouth Opening  Dental no notable dental hx. (+) Teeth Intact, Dental Advisory Given   Pulmonary neg pulmonary ROS, former smoker,    Pulmonary exam normal breath sounds clear to auscultation       Cardiovascular negative cardio ROS Normal cardiovascular exam Rhythm:Regular Rate:Normal     Neuro/Psych negative neurological ROS  negative psych ROS   GI/Hepatic negative GI ROS, (+) Hepatitis -, B  Endo/Other  negative endocrine ROS  Renal/GU negative Renal ROS  negative genitourinary   Musculoskeletal negative musculoskeletal ROS (+)   Abdominal   Peds  Hematology negative hematology ROS (+)   Anesthesia Other Findings   Reproductive/Obstetrics                            Anesthesia Physical Anesthesia Plan  ASA: 2  Anesthesia Plan: General   Post-op Pain Management:    Induction: Intravenous  PONV Risk Score and Plan: 2 and Midazolam, Dexamethasone and Ondansetron  Airway Management Planned: Oral ETT  Additional Equipment:   Intra-op Plan:   Post-operative Plan: Extubation in OR  Informed Consent: I have reviewed the patients History and Physical, chart, labs and discussed the procedure including the risks, benefits and alternatives for the proposed anesthesia with the patient or authorized representative who has indicated his/her understanding and acceptance.     Dental advisory given  Plan Discussed with: CRNA  Anesthesia Plan Comments:         Anesthesia Quick Evaluation

## 2021-05-17 NOTE — H&P (Signed)
   Synopsis: Referred for right hilar mass, mediastinal LAD by No ref. provider found  Subjective:   PATIENT ID: Sergio Lawson GENDER: male DOB: 24-Dec-1955, MRN: [id number]  No chief complaint on file.  34KK with history of HBV, smoking who is referred for R hilar mass and mediastinal LAD. Complains of cough for several months, dyspnea on exertion  He is ex-smoker with 10-pack-year smoking history.  Quit in 2011 Works for [employer] from [place]  Interim history: Patient presents for planned endobronchial ultrasound bronchoscopy and biopsy No new complaints today.   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HCC)      Objective:  Blood pressure (!) 157/83, pulse 76, temperature 98 F (36.7 C), temperature source Oral, resp. rate 18, height 5\' 4"  (1.626 m), weight 77.1 kg, SpO2 97 %. Gen:      No acute distress HEENT:  EOMI, sclera anicteric Neck:     No masses; no thyromegaly Lungs:    Clear to auscultation bilaterally; normal respiratory effort CV:         Regular rate and rhythm; no murmurs Abd:      + bowel sounds; soft, non-tender; no palpable masses, no distension Ext:    No edema; adequate peripheral perfusion Skin:      Warm and dry; no rash Neuro: alert and oriented x 3 Psych: normal mood and affect     Assessment & Plan:  Right hilar mass with associated hilar and mediastinal lymphadenopathy Plan for bronchoscopy with endobronchial ultrasound biopsy for diagnosis and staging Discussed benefit discussed with patient and he is OK to proceed  MD [facility] See Amion for pager  If no response to pager , please call [phone] until 7pm After 7:00 pm call Elink  [phone] 05/17/2021, 10:27 AM

## 2021-05-17 NOTE — Anesthesia Postprocedure Evaluation (Signed)
Anesthesia Post Note  Patient: Sergio Lawson  Procedure(s) Performed: VIDEO BRONCHOSCOPY WITH ENDOBRONCHIAL ULTRASOUND FINE NEEDLE ASPIRATION (FNA) LINEAR BRONCHIAL WASHINGS BRONCHIAL BIOPSIES BRONCHIAL BRUSHINGS     Patient location during evaluation: Endoscopy Anesthesia Type: General Level of consciousness: awake and alert Pain management: pain level controlled Vital Signs Assessment: post-procedure vital signs reviewed and stable Respiratory status: spontaneous breathing, nonlabored ventilation and respiratory function stable Cardiovascular status: blood pressure returned to baseline and stable Postop Assessment: no apparent nausea or vomiting Anesthetic complications: no   No notable events documented.  Last Vitals:  Vitals:   05/17/21 1317 05/17/21 1327  BP: (!) 144/89 (!) 141/77  Pulse: 70 65  Resp: (!) 21 18  Temp:    SpO2: 95% 94%    Last Pain:  Vitals:   05/17/21 1327  TempSrc:   PainSc: 0-No pain                 Cecile Hearing

## 2021-05-17 NOTE — Transfer of Care (Signed)
Immediate Anesthesia Transfer of Care Note  Patient: Sergio Lawson  Procedure(s) Performed: VIDEO BRONCHOSCOPY WITH ENDOBRONCHIAL ULTRASOUND FINE NEEDLE ASPIRATION (FNA) LINEAR BRONCHIAL WASHINGS BRONCHIAL BIOPSIES BRONCHIAL BRUSHINGS  Patient Location: Endoscopy Unit  Anesthesia Type:General  Level of Consciousness: awake, alert  and oriented  Airway & Oxygen Therapy: Patient Spontanous Breathing and Patient connected to nasal cannula oxygen  Post-op Assessment: Report given to RN and Post -op Vital signs reviewed and stable  Post vital signs: Reviewed and stable  Last Vitals:  Vitals Value Taken Time  BP 155/91 05/17/21 1257  Temp    Pulse 75 05/17/21 1259  Resp 20 05/17/21 1259  SpO2 96 % 05/17/21 1259  Vitals shown include unvalidated device data.  Last Pain:  Vitals:   05/17/21 0926  TempSrc:   PainSc: 2       Patients Stated Pain Goal: 3 (05/17/21 0926)  Complications: No notable events documented.

## 2021-05-17 NOTE — Anesthesia Procedure Notes (Signed)
Procedure Name: Intubation Date/Time: 05/17/2021 10:45 AM Performed by: Adria Dill, CRNA Pre-anesthesia Checklist: Patient identified, Emergency Drugs available, Suction available and Patient being monitored Patient Re-evaluated:Patient Re-evaluated prior to induction Oxygen Delivery Method: Circle system utilized Preoxygenation: Pre-oxygenation with 100% oxygen Induction Type: IV induction Ventilation: Mask ventilation without difficulty Laryngoscope Size: Miller and 2 Grade View: Grade I Tube type: Oral Tube size: 8.0 mm Number of attempts: 1 Airway Equipment and Method: Stylet Placement Confirmation: ETT inserted through vocal cords under direct vision, positive ETCO2 and breath sounds checked- equal and bilateral Secured at: 22 cm Tube secured with: Tape Dental Injury: Teeth and Oropharynx as per pre-operative assessment

## 2021-05-18 ENCOUNTER — Telehealth: Payer: Self-pay | Admitting: Pulmonary Disease

## 2021-05-18 ENCOUNTER — Encounter (HOSPITAL_COMMUNITY): Payer: Self-pay | Admitting: Pulmonary Disease

## 2021-05-18 DIAGNOSIS — R590 Localized enlarged lymph nodes: Secondary | ICD-10-CM

## 2021-05-18 LAB — CYTOLOGY - NON PAP

## 2021-05-18 NOTE — Telephone Encounter (Signed)
Called and spoke with daughter to let her know the recs from Dr. Isaiah Serge and get patient scheduled for CXR and appt with APP. Patient is now scheduled for OV and CXR ordered. Nothing further needed at this time.  Next Appt With Pulmonology Glenford Bayley, NP)05/19/2021 at  3:00 PM

## 2021-05-18 NOTE — Telephone Encounter (Signed)
Temp of 99 and specks of blood are common after the procedure as we did washings and biopsies. He can tale tylenol, motrin over the counter. Use mucinex for chest congestion.  Please order chest x ray ASAP and acute visit. I can see him at 8:45 pm on Friday if he cannot get in sooner.

## 2021-05-18 NOTE — Telephone Encounter (Signed)
Called and spoke with daughter Sergio Lawson who states that patient is having symptoms of chest pain, shortness of breath and coughing up blood. Had Bronch procedure yesterday. She states that he is making a "gurgling" noise, has a low grade fever of 99 as of an hour ago and coughing up some speckles of blood. Denies patient coughing up clots or big amounts of blood.    Dr. Isaiah Serge please advise

## 2021-05-19 ENCOUNTER — Ambulatory Visit (INDEPENDENT_AMBULATORY_CARE_PROVIDER_SITE_OTHER): Payer: Medicare Other

## 2021-05-19 ENCOUNTER — Encounter: Payer: Self-pay | Admitting: Primary Care

## 2021-05-19 ENCOUNTER — Ambulatory Visit (INDEPENDENT_AMBULATORY_CARE_PROVIDER_SITE_OTHER): Payer: Medicare Other | Admitting: Primary Care

## 2021-05-19 ENCOUNTER — Other Ambulatory Visit: Payer: Self-pay

## 2021-05-19 VITALS — BP 120/82 | HR 80 | Temp 98.2°F | Ht 64.0 in | Wt 165.0 lb

## 2021-05-19 DIAGNOSIS — R590 Localized enlarged lymph nodes: Secondary | ICD-10-CM | POA: Diagnosis not present

## 2021-05-19 DIAGNOSIS — J984 Other disorders of lung: Secondary | ICD-10-CM | POA: Diagnosis not present

## 2021-05-19 DIAGNOSIS — R918 Other nonspecific abnormal finding of lung field: Secondary | ICD-10-CM

## 2021-05-19 LAB — ACID FAST SMEAR (AFB, MYCOBACTERIA)
Acid Fast Smear: NEGATIVE
Acid Fast Smear: NEGATIVE
Acid Fast Smear: NEGATIVE
Acid Fast Smear: NEGATIVE
Acid Fast Smear: NEGATIVE

## 2021-05-19 MED ORDER — AMOXICILLIN-POT CLAVULANATE 875-125 MG PO TABS: 1.0000 | ORAL_TABLET | Freq: Two times a day (BID) | ORAL | 0 refills | Status: DC

## 2021-05-19 NOTE — Patient Instructions (Addendum)
CXR showed some trauma and/or superimposed infection to right lung from bronchoscopy. Needs antibiotic for 7 days and repeat CXR in 1 week   Recommendations: - Continue Tylenol every 6 hours for the next 2-3 days as needed for fever/chills - Continue Mucinex 600mg  twice daily for chest congestion  - Push oral fluids   Orders: - CXR (completed)  Rx: - Augmentin 1 tab twice daily x 7 days   Follow-up: - 1 week with CXR prior with Dr. or Thora Lance NP

## 2021-05-19 NOTE — Progress Notes (Signed)
@Patient  ID: Sergio Lawson , male    DOB: 1955-09-11, 65 y.o.   MRN: [id number]  No chief complaint on file.   Referring provider: [id number], MD  HPI: 65 year old male, former smoker quit in 2011 (10 pack year). PMH significant for HBV, smoking who is referred for R hilar mass and mediastinal LAD. Patient of Dr. 2012.  Previous [facility] pulmonary encounter: 05/13/21- Dr. 05/15/21, Consult  He does have a cough over last several months. but his PCP has prescribed something that has helped. No hemoptysis. No CP. He does have DOE over last 4-5 months. He has no fever. No weight loss. No night sweats  except during summer time.  Can walk up a couple flights of stairs without stopping due to DOE.  Otherwise pertinent review of systems is negative.  No family history of lung disease or lung cancer.  He smoked 20 years half ppd, quit in 2011. He works for [employer] He is not exposed to any dusts or pariculates without a mask. Lives in [place]. From [place], has lived here in [place] since 1983. Last year went back to [place], 3 months ago to [place].   05/19/2021- Interim hx  Patient presents today for acute visit. He underwent bronchoscopy with Dr. 05/21/2021 on 10/24 for right hilar mass with mediastinal lymphadenopathy. He called our office on 10/25 with reports of low grade temp, shortness of breath, chest congestion and cough mild hemoptysis. Dr. 11/25 advised he take Tylenol and Mucinex and come in for imaging. CXR today showed rounded cystic lesion suggestive of traumatic pneumatocele, new small right effusion and focus of pulmonary hemorrhage right midlung.  Cytology remains pending.    Allergies  Allergen Reactions   Codeine Nausea Only   Levaquin [Levofloxacin] Nausea Only    Immunization History  Administered Date(s) Administered   Influenza Inj Mdck Quad Pf 04/02/2019   Influenza Split 05/27/2011   Influenza,inj,Quad PF,6+ Mos 05/01/2015, 07/24/2018    Influenza-Unspecified 04/06/2021   PFIZER(Purple Top)SARS-COV-2 Vaccination 10/16/2019, 11/11/2019, 06/02/2020   PNEUMOCOCCAL CONJUGATE-20 10/14/2020   Tdap 07/25/1990    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HCC)     Tobacco History: Social History   Tobacco Use  Smoking Status Former   Types: Cigarettes   Quit date: 07/25/2009   Years since quitting: 11.8  Smokeless Tobacco Never   Counseling given: Not Answered   Outpatient Medications Prior to Visit  Medication Sig Dispense Refill   acetaminophen (TYLENOL) 500 MG tablet Take 500-1,000 mg by mouth every 6 (six) hours as needed for mild pain.     No facility-administered medications prior to visit.    Review of Systems  Review of Systems  Constitutional:  Positive for fever.  Respiratory:  Positive for cough. Negative for choking, wheezing and stridor.   Cardiovascular:  Positive for chest pain. Negative for palpitations and leg swelling.    Physical Exam  BP 120/82 (BP Location: Right Arm, Patient Position: Sitting, Cuff Size: Normal)   Pulse 80   Temp 98.2 F (36.8 C) (Oral)   Ht 5\' 4"  (1.626 m)   Wt 165 lb (74.8 kg)   SpO2 99%   BMI 28.32 kg/m  Physical Exam Constitutional:      General: He is not in acute distress.    Appearance: Normal appearance. He is ill-appearing.  HENT:     Head: Normocephalic and atraumatic.  Cardiovascular:     Rate and Rhythm: Normal rate.  Pulmonary:  Effort: Pulmonary effort is normal.     Breath sounds: Rhonchi present. No wheezing or rales.  Neurological:     General: No focal deficit present.     Mental Status: He is alert and oriented to person, place, and time. Mental status is at baseline.  Psychiatric:        Mood and Affect: Mood normal.        Behavior: Behavior normal.        Thought Content: Thought content normal.        Judgment: Judgment normal.     Lab Results:  CBC    Component Value Date/Time   WBC 5.5 04/27/2021 1253    RBC 5.58 04/27/2021 1253   HGB 14.1 04/27/2021 1253   HCT 44.8 04/27/2021 1253   PLT 197 04/27/2021 1253   MCV 80.3 04/27/2021 1253   MCH 25.3 (L) 04/27/2021 1253   MCHC 31.5 (L) 04/27/2021 1253   RDW 15.2 (H) 04/27/2021 1253   LYMPHSABS 1,711 04/27/2021 1253   EOSABS 209 04/27/2021 1253   BASOSABS 28 04/27/2021 1253    BMET    Component Value Date/Time   NA 140 04/27/2021 1253   K 4.3 04/27/2021 1253   CL 104 04/27/2021 1253   CO2 29 04/27/2021 1253   GLUCOSE 107 04/27/2021 1253   BUN 12 04/27/2021 1253   CREATININE 0.88 04/27/2021 1253   CALCIUM 9.1 04/27/2021 1253    BNP No results found for: BNP  ProBNP No results found for: PROBNP  Imaging: DG Chest 2 View  Result Date: 05/19/2021 CLINICAL DATA:  Short of breath EXAM: CHEST - 2 VIEW COMPARISON:  04/27/2021 FINDINGS: Normal cardiac silhouette. Focus of increased density measuring 2 cm in the mid RIGHT lung about the hilum suggest focus of pulmonary atelectasis or hemorrhage/contusion. More superiorly there is a 4.6 cm round cystic appearing lesion. No pneumothorax.  Small RIGHT effusion is increased from prior. IMPRESSION: 1. New small RIGHT effusion and focus of pulmonary hemorrhage in the RIGHT midlung. 2. Superior to the presumed contusion there is a rounded cystic lesion suggesting traumatic pneumatocele. These results will be called to the ordering clinician or representative by the Radiologist Assistant, and communication documented in the PACS or [employer]. Electronically Signed   By: Genevive Bi M.D.   On: 05/19/2021 15:24   DG Chest 2 View  Result Date: 04/26/2021 CLINICAL DATA:  65 year old male with history of intermittent cough for the past month and a half. Former smoker. EXAM: CHEST - 2 VIEW COMPARISON:  Chest x-ray 04/09/2021. FINDINGS: Ill-defined perihilar opacity in the right mid lung, with evidence of persistent right middle lobe atelectasis on the lateral projection. Left lung is clear.  No pleural effusions. No pneumothorax. No evidence of pulmonary edema. Heart size is normal. IMPRESSION: 1. Persistent ill-defined right perihilar opacity with evidence of persistent right middle lobe atelectasis or scarring. The possibility of a central obstructing neoplasm warrants consideration, and further evaluation with contrast enhanced chest CT is strongly recommended at this time. These results will be called to the ordering clinician or representative by the Radiologist Assistant, and communication documented in the PACS or [employer]. Electronically Signed   By: Trudie Reed M.D.   On: 04/26/2021 12:28   CT Chest W Contrast  Result Date: 04/30/2021 CLINICAL DATA:  Cough.  Abnormal chest x-ray. EXAM: CT CHEST WITH CONTRAST TECHNIQUE: Multidetector CT imaging of the chest was performed during intravenous contrast administration. CONTRAST:  71mL ISOVUE-300 IOPAMIDOL (ISOVUE-300) INJECTION 61% COMPARISON:  April 26, 2021. FINDINGS: Cardiovascular: No evidence of thoracic aortic aneurysm. Normal cardiac size. No pericardial effusion. Coronary artery calcifications are noted. Mediastinum/Nodes: Thyroid gland is unremarkable. The esophagus is unremarkable. 10 mm subcarinal lymph node is noted. 12 mm precarinal lymph node is noted. 10 mm right hilar lymph node is noted. There appears to be occlusion of right middle lobe bronchus secondary to probable hilar mass or malignancy. Lungs/Pleura: No pneumothorax is noted. Left lung is clear. There appears to be some degree of right middle lobe atelectasis secondary to previously described occlusion or compression of the right mainstem bronchus secondary to probable hilar mass or malignancy. Upper Abdomen: No acute abnormality. Musculoskeletal: No chest wall abnormality. No acute or significant osseous findings. IMPRESSION: There appears to be occlusion or compression of the right middle lobe bronchus secondary to probable right hilar mass or possibly  endobronchial lesion concerning for malignancy. This results in atelectasis of the right middle lobe. There is also noted mildly enlarged right hilar, subcarinal and precarinal adenopathy concerning for metastatic disease. Bronchoscopy is recommended for further evaluation. These results will be called to the ordering clinician or representative by the Radiologist Assistant, and communication documented in the PACS or [employer]. Electronically Signed   By: Lupita Raider M.D.   On: 04/30/2021 08:37   DG CHEST PORT 1 VIEW  Result Date: 05/17/2021 CLINICAL DATA:  Status post bronchoscopy EXAM: PORTABLE CHEST 1 VIEW COMPARISON:  04/26/2021 FINDINGS: The heart size and mediastinal contours are within normal limits. Unchanged, masslike consolidation of the perihilar right lung. The visualized skeletal structures are unremarkable. IMPRESSION: Unchanged, masslike consolidation of the perihilar right lung. No acute appearing airspace opacity. No pneumothorax. Electronically Signed   By: Jearld Lesch M.D.   On: 05/17/2021 13:34     Assessment & Plan:   Pneumatocele of lung - S/p bronchoscopy on 05/17/21 with low grade fever and cough. Reviewed imaging with Dr. Isaiah Serge. Sending in Augmentin 875-125mg  twice daily x 7 days. Recommend patient continue to take Tylenol q 6 hours for fever/chills and mucinex twice daily for chest congestion. Needs repeat CXR in 1 week.   Hilar mass - Cytology remains pending    Glenford Bayley, NP 05/19/2021

## 2021-05-19 NOTE — Assessment & Plan Note (Signed)
-   Cytology remains pending

## 2021-05-19 NOTE — Assessment & Plan Note (Addendum)
-   S/p bronchoscopy on 05/17/21 with low grade fever and cough. Reviewed imaging with Dr. Isaiah Serge. Sending in Augmentin 875-125mg  twice daily x 7 days. Recommend patient continue to take Tylenol q 6 hours for fever/chills and mucinex twice daily for chest congestion. Needs repeat CXR in 1 week.

## 2021-05-20 ENCOUNTER — Telehealth: Payer: Self-pay | Admitting: Pulmonary Disease

## 2021-05-20 DIAGNOSIS — J181 Lobar pneumonia, unspecified organism: Secondary | ICD-10-CM

## 2021-05-20 LAB — CULTURE, BAL-QUANTITATIVE W GRAM STAIN
Culture: NO GROWTH
Culture: 2000 — AB
Gram Stain: NONE SEEN

## 2021-05-20 LAB — CULTURE, RESPIRATORY W GRAM STAIN
Culture: NO GROWTH
Culture: NO GROWTH
Culture: NO GROWTH
Gram Stain: NONE SEEN
Gram Stain: NONE SEEN

## 2021-05-20 MED ORDER — CEFPODOXIME PROXETIL 200 MG PO TABS: 200.0000 mg | ORAL_TABLET | Freq: Two times a day (BID) | ORAL | 0 refills | Status: DC

## 2021-05-20 MED ORDER — CEFPODOXIME PROXETIL 200 MG PO TABS: 200.0000 mg | ORAL_TABLET | Freq: Two times a day (BID) | ORAL | Status: AC

## 2021-05-20 NOTE — Addendum Note (Signed)
Addended by: Lajoyce Lauber A on: 05/20/2021 04:03 PM   Modules accepted: Orders

## 2021-05-20 NOTE — Telephone Encounter (Signed)
Cefpodoxime was ordered as 'clinic administered'. Rx corrected and sent to [facility].  Patient daughter, Sergio Lawson(DPR) is aware and voiced his understanding.  Nothing further needed at this time.

## 2021-05-20 NOTE — Telephone Encounter (Signed)
Thank you, appreciate you keeping me updated

## 2021-05-20 NOTE — Telephone Encounter (Signed)
I called and discussed with daughter He is feeling much better  Cultures are growing strep salivarius with intermediate sensitivity to penicillin. New prescription for cefpodoxime x 7 days sent to his pharmacy and instruction given  He will get a repeat CXR in 1 week

## 2021-05-24 ENCOUNTER — Ambulatory Visit (INDEPENDENT_AMBULATORY_CARE_PROVIDER_SITE_OTHER): Payer: Medicare Other | Admitting: Pulmonary Disease

## 2021-05-24 ENCOUNTER — Other Ambulatory Visit: Payer: Self-pay

## 2021-05-24 DIAGNOSIS — F17200 Nicotine dependence, unspecified, uncomplicated: Secondary | ICD-10-CM

## 2021-05-24 LAB — PULMONARY FUNCTION TEST
DL/VA % pred: 124 %
DL/VA: 5.27 ml/min/mmHg/L
DLCO cor % pred: 78 %
DLCO cor: 17.2 ml/min/mmHg
DLCO unc % pred: 77 %
DLCO unc: 16.96 ml/min/mmHg
FEF 25-75 Post: 1.49 L/sec
FEF 25-75 Pre: 1.37 L/sec
FEF2575-%Change-Post: 9 %
FEV1-%Change-Post: 4 %
FEV1-Post: 1.7 L
FEV1-Pre: 1.63 L
FEV1FVC-%Change-Post: 0 %
FEV6-%Change-Post: 5 %
FEV6-Post: 2.21 L
FEV6-Pre: 2.11 L
FEV6FVC-%Change-Post: 0 %
FVC-%Change-Post: 4 %
FVC-Post: 2.21 L
FVC-Pre: 2.12 L
Post FEV1/FVC ratio: 77 %
Post FEV6/FVC ratio: 100 %
Pre FEV1/FVC ratio: 77 %
Pre FEV6/FVC Ratio: 100 %
RV % pred: 211 %
RV: 4.3 L
TLC % pred: 113 %
TLC: 6.58 L

## 2021-05-24 NOTE — Progress Notes (Signed)
Full PFT completed today ? ?

## 2021-05-26 ENCOUNTER — Other Ambulatory Visit: Payer: Self-pay

## 2021-05-26 NOTE — Progress Notes (Signed)
Synopsis: Referred for right hilar mass, mediastinal LAD by Elby Showers, MD  Subjective:   PATIENT ID: Sergio Lawson GENDER: male DOB: 02/19/1956, MRN: [id number]  Chief Complaint  Patient presents with   Follow-up    Cough has improved, CXR done today.    65yM with history of HBV, smoking 10 py who is referred for R hilar mass and mediastinal LAD.  Interval HPI: Underwent EBUS with EBNA of 7S, right hilar mass, EBBx/brushing right hilar mass, BAL which was revealing only for strep salivarius from BAL. Did have pneumatocele post procedurally with likely related small pleural effusion and some hemoptysis. He has been treated cefpodoxime and his productive cough and hemoptysis have improved. Only has some CP with cough. Some DOE which is unchanged prior to the procedure. No fever.   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[facility])      Family History  Problem Relation Age of Onset   Cancer Mother      Past Surgical History:  Procedure Laterality Date   BRONCHIAL BIOPSY  05/17/2021   Procedure: BRONCHIAL BIOPSIES;  Surgeon: Marshell Garfinkel, MD;  Location: [facility];  Service: Cardiopulmonary;;   BRONCHIAL BRUSHINGS  05/17/2021   Procedure: BRONCHIAL BRUSHINGS;  Surgeon: Marshell Garfinkel, MD;  Location: [facility];  Service: Cardiopulmonary;;   BRONCHIAL WASHINGS  05/17/2021   Procedure: BRONCHIAL WASHINGS;  Surgeon: Marshell Garfinkel, MD;  Location: [facility];  Service: Cardiopulmonary;;   FINE NEEDLE ASPIRATION  05/17/2021   Procedure: FINE NEEDLE ASPIRATION (FNA) LINEAR;  Surgeon: Marshell Garfinkel, MD;  Location: [facility];  Service: Cardiopulmonary;;   LUMBAR [place] SURGERY  2011   x 2012   SPINE SURGERY  07/25/2009   herniated disc   VIDEO BRONCHOSCOPY WITH ENDOBRONCHIAL ULTRASOUND N/A 05/17/2021   Procedure: VIDEO BRONCHOSCOPY WITH ENDOBRONCHIAL ULTRASOUND;  Surgeon: Marshell Garfinkel, MD;  Location: [facility];  Service: Cardiopulmonary;  Laterality:  N/A;    Social History   Socioeconomic History   Marital status: Single    Spouse name: Not on file   Number of children: Not on file   Years of education: Not on file   Highest education level: Not on file  Occupational History   Not on file  Tobacco Use   Smoking status: Former    Types: Cigarettes    Quit date: 07/25/2009    Years since quitting: 11.8   Smokeless tobacco: Never  Vaping Use   Vaping Use: Never used  Substance and Sexual Activity   Alcohol use: Yes    Alcohol/week: 1.0 standard drink    Types: 1 Cans of beer per week   Drug use: Never   Sexual activity: Not on file  Other Topics Concern   Not on file  Social History Narrative   Not on file   Social Determinants of Health   Financial Resource Strain: Not on file  Food Insecurity: Not on file  Transportation Needs: Not on file  Physical Activity: Not on file  Stress: Not on file  Social Connections: Not on file  Intimate Partner Violence: Not on file     Allergies  Allergen Reactions   Codeine Nausea Only   Levaquin [Levofloxacin] Nausea Only     Outpatient Medications Prior to Visit  Medication Sig Dispense Refill   acetaminophen (TYLENOL) 500 MG tablet Take 500-1,000 mg by mouth every 6 (six) hours as needed for mild pain.     cefpodoxime (VANTIN) 200 MG tablet Take 1 tablet (200 mg  total) by mouth 2 (two) times daily. 14 tablet 0   No facility-administered medications prior to visit.       Objective:   Physical Exam:  General appearance: 65 y.o., male, NAD, conversant  Eyes: anicteric sclerae, moist conjunctivae; no lid-lag; PERRL, tracking appropriately HENT: NCAT; oropharynx, MMM, no mucosal ulcerations; normal hard and soft palate Neck: Trachea midline; no lymphadenopathy, no JVD Lungs: CTAB, no crackles, no wheeze, with normal respiratory effort CV: RRR, no MRGs  Abdomen: Soft, non-tender; non-distended, BS present  Extremities: No peripheral edema, radial and DP pulses  present bilaterally  Skin: Normal temperature, turgor and texture; no rash Psych: Appropriate affect Neuro: Alert and oriented to person and place, no focal deficit    Vitals:   05/27/21 1553  BP: (!) 142/80  Pulse: 78  Temp: 97.6 F (36.4 C)  SpO2: 100%  Weight: 169 lb 9.6 oz (76.9 kg)  Height: _0  (1.626 m)    100% on RA BMI Readings from Last 3 Encounters:  05/27/21 29.11 kg/m  05/19/21 28.32 kg/m  05/17/21 29.18 kg/m   Wt Readings from Last 3 Encounters:  05/27/21 169 lb 9.6 oz (76.9 kg)  05/19/21 165 lb (74.8 kg)  05/17/21 170 lb (77.1 kg)     CBC    Component Value Date/Time   WBC 5.5 04/27/2021 1253   RBC 5.58 04/27/2021 1253   HGB 14.1 04/27/2021 1253   HCT 44.8 04/27/2021 1253   PLT 197 04/27/2021 1253   MCV 80.3 04/27/2021 1253   MCH 25.3 (L) 04/27/2021 1253   MCHC 31.5 (L) 04/27/2021 1253   RDW 15.2 (H) 04/27/2021 1253   LYMPHSABS 1,711 04/27/2021 1253   EOSABS 209 04/27/2021 1253   BASOSABS 28 04/27/2021 1253    BAL resp cx with strep salivarius 2000 colonies/mL Bronchial wash cx negative  Cyto: 7S adequate, negative RML FNA neg (benign reparative changes), brushing benign reparative changes, RML bx negative  Chest Imaging: CT Chest 04/30/21 reviewed by me and remarkable for right hilar mass causing atelectasis of RML and inferior portion of RUL, mediastinal LAD  CXR 05/19/21 reviewed by me with pneumatocele and small effusion  CXR 05/27/21 reviewed by me with nearly completely resolved pneumatocele, small effusion and some progression of RML atelectasis  Pulmonary Functions Testing Results: PFT Results Latest Ref Rng & Units 05/24/2021  FVC-Pre L 2.12  FVC-Post L 2.21  Pre FEV1/FVC % % 77  Post FEV1/FCV % % 77  FEV1-Pre L 1.63  FEV1-Post L 1.70  DLCO uncorrected ml/min/mmHg 16.96  DLCO UNC% % 77  DLCO corrected ml/min/mmHg 17.20  DLCO COR %Predicted % 78  DLVA Predicted % 124  TLC L 6.58  TLC % Predicted % 113  RV % Predicted  % 211   Reviewed by me, significant for air trapping      Assessment & Plan:   # Right hilar mass: # Mediastinal LAD: Unclear etiology. Perhaps right hilar mass represents necrotizing pneumonia/abscess vs noninfectious inflammatory condition vs malignancy. Still waiting on AFB cultures from bronch.  # DOE # History of smoking: # Air trapping: No obstruction but given air trapping in this non-obese pt without clinical evidence of heart failure I think it's worth trying an inhaler.  Plan: - complete 4 week course of cefpodoxime - repeat CT Chest in 8 weeks  - start anoro 1 puff once daily, sample given today and instructed in use   RTC 2 months to discuss results   Maryjane Hurter, MD [facility] 05/27/2021 4:14 PM

## 2021-05-27 ENCOUNTER — Encounter: Payer: Self-pay | Admitting: Student

## 2021-05-27 ENCOUNTER — Ambulatory Visit (INDEPENDENT_AMBULATORY_CARE_PROVIDER_SITE_OTHER): Payer: Medicare Other | Admitting: Student

## 2021-05-27 ENCOUNTER — Ambulatory Visit (INDEPENDENT_AMBULATORY_CARE_PROVIDER_SITE_OTHER): Payer: Medicare Other

## 2021-05-27 ENCOUNTER — Other Ambulatory Visit: Payer: Self-pay

## 2021-05-27 VITALS — BP 142/80 | HR 78 | Temp 97.6°F | Ht 64.0 in | Wt 169.6 lb

## 2021-05-27 DIAGNOSIS — R0989 Other specified symptoms and signs involving the circulatory and respiratory systems: Secondary | ICD-10-CM

## 2021-05-27 DIAGNOSIS — J984 Other disorders of lung: Secondary | ICD-10-CM | POA: Diagnosis not present

## 2021-05-27 DIAGNOSIS — R918 Other nonspecific abnormal finding of lung field: Secondary | ICD-10-CM

## 2021-05-27 MED ORDER — CEFPODOXIME PROXETIL 200 MG PO TABS: 200.0000 mg | ORAL_TABLET | Freq: Two times a day (BID) | ORAL | 0 refills | Status: DC

## 2021-05-27 MED ORDER — ANORO ELLIPTA 62.5-25 MCG/ACT IN AEPB: 1.0000 | INHALATION_SPRAY | Freq: Every day | RESPIRATORY_TRACT | 11 refills | Status: DC

## 2021-05-27 MED ORDER — UMECLIDINIUM-VILANTEROL 62.5-25 MCG/ACT IN AEPB: 1.0000 | INHALATION_SPRAY | Freq: Every day | RESPIRATORY_TRACT | 0 refills | Status: DC

## 2021-05-27 NOTE — Patient Instructions (Signed)
-   1 puff once daily of anoro in the morning - keep taking cefpodoxime for 3 more weeks - CT Chest in 8 weeks - clinic visit after CT Chest

## 2021-06-15 LAB — FUNGUS CULTURE RESULT

## 2021-06-15 LAB — FUNGUS CULTURE WITH STAIN

## 2021-06-15 LAB — FUNGAL ORGANISM REFLEX

## 2021-06-21 ENCOUNTER — Ambulatory Visit: Payer: Medicare Other | Admitting: Student

## 2021-06-30 LAB — ACID FAST CULTURE WITH REFLEXED SENSITIVITIES (MYCOBACTERIA)
Acid Fast Culture: NEGATIVE
Acid Fast Culture: NEGATIVE
Acid Fast Culture: NEGATIVE

## 2021-07-08 LAB — ACID FAST CULTURE WITH REFLEXED SENSITIVITIES (MYCOBACTERIA)
Acid Fast Culture: NEGATIVE
Acid Fast Culture: NEGATIVE

## 2021-07-27 ENCOUNTER — Other Ambulatory Visit: Payer: Medicare Other

## 2021-08-04 ENCOUNTER — Ambulatory Visit: Payer: Medicare Other | Admitting: Student

## 2021-08-17 ENCOUNTER — Ambulatory Visit: Payer: Medicare Other | Admitting: Student

## 2021-08-18 ENCOUNTER — Other Ambulatory Visit: Payer: Self-pay

## 2021-08-18 ENCOUNTER — Ambulatory Visit
Admission: RE | Admit: 2021-08-18 | Discharge: 2021-08-18 | Disposition: A | Discharge status: Home / Self Care | Payer: Medicare Other | Source: Ambulatory Visit | Attending: Student | Admitting: Student

## 2021-08-18 DIAGNOSIS — R918 Other nonspecific abnormal finding of lung field: Secondary | ICD-10-CM

## 2021-08-26 ENCOUNTER — Other Ambulatory Visit: Payer: Self-pay

## 2021-08-26 ENCOUNTER — Ambulatory Visit (INDEPENDENT_AMBULATORY_CARE_PROVIDER_SITE_OTHER): Payer: Medicare Other | Admitting: Student

## 2021-08-26 ENCOUNTER — Encounter: Payer: Self-pay | Admitting: Student

## 2021-08-26 VITALS — BP 148/80 | HR 69 | Temp 97.9°F | Ht 64.0 in | Wt 173.0 lb

## 2021-08-26 DIAGNOSIS — R918 Other nonspecific abnormal finding of lung field: Secondary | ICD-10-CM

## 2021-08-26 NOTE — Patient Instructions (Addendum)
-   you will be called to schedule PET/CT and I will see you in 4 weeks in clinic to discuss

## 2021-08-26 NOTE — Progress Notes (Signed)
Synopsis: Referred for right hilar mass, mediastinal LAD by Elby Showers, MD  Subjective:   PATIENT ID: Sergio Lawson GENDER: male DOB: 1955/10/13, MRN: [id number]  Chief Complaint  Patient presents with   Follow-up    Cough has resolved and his breathing is much improved. He has occ pain in his right side when taking a deep breath.     65yM with history of HBV, smoking 10 py who is referred for R hilar mass and mediastinal LAD.  Interval HPI: No substantial radiographic change after 4 week course cefpodoxime in setting possible strep salivarius abscess/lymphadenitis. Feeling good overall. No cough at all, hemoptysis has resolved. Sometimes feels pulling sensation or right lower CP when he takes deep breath. Weight is unchanged since last visit. No fever. No night sweats. No hemoptysis.   No family history of malignancy.   Otherwise pertinent review of systems is negative.  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[place])      Family History  Problem Relation Age of Onset   Cancer Mother      Past Surgical History:  Procedure Laterality Date   BRONCHIAL BIOPSY  05/17/2021   Procedure: BRONCHIAL BIOPSIES;  Surgeon: Marshell Garfinkel, MD;  Location: [place];  Service: Cardiopulmonary;;   BRONCHIAL BRUSHINGS  05/17/2021   Procedure: BRONCHIAL BRUSHINGS;  Surgeon: Marshell Garfinkel, MD;  Location: [place];  Service: Cardiopulmonary;;   BRONCHIAL WASHINGS  05/17/2021   Procedure: BRONCHIAL WASHINGS;  Surgeon: Marshell Garfinkel, MD;  Location: [place];  Service: Cardiopulmonary;;   FINE NEEDLE ASPIRATION  05/17/2021   Procedure: FINE NEEDLE ASPIRATION (FNA) LINEAR;  Surgeon: Marshell Garfinkel, MD;  Location: [facility];  Service: Cardiopulmonary;;   LUMBAR [place] SURGERY  2011   x 2012   SPINE SURGERY  07/25/2009   herniated disc   VIDEO BRONCHOSCOPY WITH ENDOBRONCHIAL ULTRASOUND N/A 05/17/2021   Procedure: VIDEO BRONCHOSCOPY WITH ENDOBRONCHIAL  ULTRASOUND;  Surgeon: Marshell Garfinkel, MD;  Location: [facility];  Service: Cardiopulmonary;  Laterality: N/A;    Social History   Socioeconomic History   Marital status: Single    Spouse name: Not on file   Number of children: Not on file   Years of education: Not on file   Highest education level: Not on file  Occupational History   Not on file  Tobacco Use   Smoking status: Former    Packs/day: 0.50    Years: 10.00    Pack years: 5.00    Types: Cigarettes    Quit date: 07/25/2009    Years since quitting: 12.0   Smokeless tobacco: Never  Vaping Use   Vaping Use: Never used  Substance and Sexual Activity   Alcohol use: Yes    Alcohol/week: 1.0 standard drink    Types: 1 Cans of beer per week   Drug use: Never   Sexual activity: Not on file  Other Topics Concern   Not on file  Social History Narrative   Not on file   Social Determinants of Health   Financial Resource Strain: Not on file  Food Insecurity: Not on file  Transportation Needs: Not on file  Physical Activity: Not on file  Stress: Not on file  Social Connections: Not on file  Intimate Partner Violence: Not on file     Allergies  Allergen Reactions   Codeine Nausea Only   Levaquin [Levofloxacin] Nausea Only     Outpatient Medications Prior to Visit  Medication Sig Dispense Refill   acetaminophen (  TYLENOL) 500 MG tablet Take 500-1,000 mg by mouth every 6 (six) hours as needed for mild pain.     umeclidinium-vilanterol (ANORO ELLIPTA) 62.5-25 MCG/ACT AEPB Inhale 1 puff into the lungs daily. (Patient not taking: Reported on 08/26/2021) 1 each 11   cefpodoxime (VANTIN) 200 MG tablet Take 1 tablet (200 mg total) by mouth 2 (two) times daily. 42 tablet 0   umeclidinium-vilanterol (ANORO ELLIPTA) 62.5-25 MCG/ACT AEPB Inhale 1 puff into the lungs daily. 7 each 0   No facility-administered medications prior to visit.       Objective:   Physical Exam:  General appearance: 66 y.o., male, NAD,  conversant  Eyes: anicteric sclerae; PERRL, tracking appropriately HENT: NCAT; MMM Neck: Trachea midline; no lymphadenopathy, no JVD Lungs: CTAB, no crackles, no wheeze, with normal respiratory effort CV: RRR, no murmur  Abdomen: Soft, non-tender; non-distended, BS present  Extremities: No peripheral edema, warm Skin: Normal turgor and texture; no rash Psych: Appropriate affect Neuro: Alert and oriented to person and place, no focal deficit     Vitals:   08/26/21 1111  BP: (!) 148/80  Pulse: 69  Temp: 97.9 F (36.6 C)  TempSrc: Oral  SpO2: 99%  Weight: 173 lb (78.5 kg)  Height: _0  (1.626 m)    99% on RA BMI Readings from Last 3 Encounters:  08/26/21 29.70 kg/m  05/27/21 29.11 kg/m  05/19/21 28.32 kg/m   Wt Readings from Last 3 Encounters:  08/26/21 173 lb (78.5 kg)  05/27/21 169 lb 9.6 oz (76.9 kg)  05/19/21 165 lb (74.8 kg)     CBC    Component Value Date/Time   WBC 5.5 04/27/2021 1253   RBC 5.58 04/27/2021 1253   HGB 14.1 04/27/2021 1253   HCT 44.8 04/27/2021 1253   PLT 197 04/27/2021 1253   MCV 80.3 04/27/2021 1253   MCH 25.3 (L) 04/27/2021 1253   MCHC 31.5 (L) 04/27/2021 1253   RDW 15.2 (H) 04/27/2021 1253   LYMPHSABS 1,711 04/27/2021 1253   EOSABS 209 04/27/2021 1253   BASOSABS 28 04/27/2021 1253    BAL resp cx with strep salivarius 2000 colonies/mL Bronchial wash cx negative  Cyto: 7S adequate, negative RML FNA neg (benign reparative changes), brushing benign reparative changes, RML bx negative  Chest Imaging: CT Chest 04/30/21 reviewed by me and remarkable for right hilar mass causing atelectasis of RML and inferior portion of RUL, mediastinal LAD  CXR 05/19/21 reviewed by me with pneumatocele and small effusion  CXR 05/27/21 reviewed by me with nearly completely resolved pneumatocele, small effusion and some progression of RML atelectasis  CT Chest 08/18/21 reviewed by me without substantial change except that RML lesion no longer  appears heterogeneous, now looks more like atelectasis  Pulmonary Functions Testing Results: PFT Results Latest Ref Rng & Units 05/24/2021  FVC-Pre L 2.12  FVC-Post L 2.21  Pre FEV1/FVC % % 77  Post FEV1/FCV % % 77  FEV1-Pre L 1.63  FEV1-Post L 1.70  DLCO uncorrected ml/min/mmHg 16.96  DLCO UNC% % 77  DLCO corrected ml/min/mmHg 17.20  DLCO COR %Predicted % 78  DLVA Predicted % 124  TLC L 6.58  TLC % Predicted % 113  RV % Predicted % 211   Reviewed by me, significant for air trapping      Assessment & Plan:   # Right hilar mass: # Mediastinal LAD: Unclear etiology. Perhaps right hilar mass represents necrotizing pneumonia/abscess however there wasn't substantial change after course of ABX vs noninfectious inflammatory condition vs  malignancy.   # DOE # History of smoking: # Air trapping: Trialed LABA/LAMA without significant response. He's not significantly bothered by DOE anyway.  Plan: - PET/CT - stop anoro    RTC 4 weeks to discuss results  I spent 35 minutes dedicated to the care of this patient on the date of this encounter to include pre-visit review of records, face-to-face time with the patient discussing conditions above, post visit ordering of testing, clinical documentation with the electronic health record, and communicating necessary findings to members of the patients care team.     Maryjane Hurter, MD [facility] 08/26/2021 11:52 AM

## 2021-09-07 ENCOUNTER — Other Ambulatory Visit: Payer: Self-pay

## 2021-09-07 ENCOUNTER — Ambulatory Visit (HOSPITAL_COMMUNITY)
Admission: RE | Admit: 2021-09-07 | Discharge: 2021-09-07 | Disposition: A | Discharge status: Home / Self Care | Payer: Medicare Other | Source: Ambulatory Visit | Attending: Student | Admitting: Student

## 2021-09-07 DIAGNOSIS — R918 Other nonspecific abnormal finding of lung field: Secondary | ICD-10-CM | POA: Diagnosis present

## 2021-09-07 LAB — GLUCOSE, CAPILLARY: Glucose-Capillary: 105 mg/dL — ABNORMAL HIGH (ref 70–99)

## 2021-09-07 MED ORDER — FLUDEOXYGLUCOSE F - 18 (FDG) INJECTION
7.9000 | Freq: Once | INTRAVENOUS | Status: AC | PRN
  Administered 2021-09-07: 7.9 via INTRAVENOUS

## 2021-09-09 ENCOUNTER — Telehealth: Payer: Self-pay | Admitting: Student

## 2021-09-09 NOTE — Telephone Encounter (Signed)
Called and spoke with Sergio Lawson and his daughter about recommendation for repeat EBUS. They would like to discuss in clinic first. Can we schedule them for 09/16/21 with me?

## 2021-09-10 NOTE — Telephone Encounter (Signed)
Called and spoke with patient. He has been scheduled with Dr. Thora Lance on 02/23 at 215pm. He verbalized understanding of appointment.   Nothing further needed at time of call.

## 2021-09-16 ENCOUNTER — Encounter: Payer: Self-pay | Admitting: Student

## 2021-09-16 ENCOUNTER — Other Ambulatory Visit: Payer: Self-pay

## 2021-09-16 ENCOUNTER — Other Ambulatory Visit (INDEPENDENT_AMBULATORY_CARE_PROVIDER_SITE_OTHER): Payer: Medicare Other

## 2021-09-16 ENCOUNTER — Ambulatory Visit (INDEPENDENT_AMBULATORY_CARE_PROVIDER_SITE_OTHER): Payer: Medicare Other | Admitting: Student

## 2021-09-16 ENCOUNTER — Telehealth: Payer: Self-pay | Admitting: Student

## 2021-09-16 VITALS — BP 128/76 | HR 86 | Temp 98.0°F | Ht 64.0 in | Wt 171.2 lb

## 2021-09-16 DIAGNOSIS — R042 Hemoptysis: Secondary | ICD-10-CM

## 2021-09-16 DIAGNOSIS — Z114 Encounter for screening for human immunodeficiency virus [HIV]: Secondary | ICD-10-CM | POA: Diagnosis not present

## 2021-09-16 DIAGNOSIS — Z7289 Other problems related to lifestyle: Secondary | ICD-10-CM | POA: Diagnosis not present

## 2021-09-16 DIAGNOSIS — R590 Localized enlarged lymph nodes: Secondary | ICD-10-CM | POA: Diagnosis not present

## 2021-09-16 LAB — CBC WITH DIFFERENTIAL/PLATELET
Basophils Absolute: 0 10*3/uL (ref 0.0–0.1)
Basophils Relative: 0.5 % (ref 0.0–3.0)
Eosinophils Absolute: 0.2 10*3/uL (ref 0.0–0.7)
Eosinophils Relative: 4.1 % (ref 0.0–5.0)
HCT: 41.6 % (ref 39.0–52.0)
Hemoglobin: 13.2 g/dL (ref 13.0–17.0)
Lymphocytes Relative: 23.6 % (ref 12.0–46.0)
Lymphs Abs: 1.1 10*3/uL (ref 0.7–4.0)
MCHC: 31.8 g/dL (ref 30.0–36.0)
MCV: 77.1 fl — ABNORMAL LOW (ref 78.0–100.0)
Monocytes Absolute: 0.6 10*3/uL (ref 0.1–1.0)
Monocytes Relative: 12.2 % — ABNORMAL HIGH (ref 3.0–12.0)
Neutro Abs: 2.9 10*3/uL (ref 1.4–7.7)
Neutrophils Relative %: 59.6 % (ref 43.0–77.0)
Platelets: 172 10*3/uL (ref 150.0–400.0)
RBC: 5.39 Mil/uL (ref 4.22–5.81)
RDW: 15.6 % — ABNORMAL HIGH (ref 11.5–15.5)
WBC: 4.8 10*3/uL (ref 4.0–10.5)

## 2021-09-16 LAB — PROTIME-INR
INR: 1.2 ratio — ABNORMAL HIGH (ref 0.8–1.0)
Prothrombin Time: 12.8 s (ref 9.6–13.1)

## 2021-09-16 NOTE — Progress Notes (Signed)
Synopsis: Referred for right hilar mass, mediastinal LAD by Elby Showers, MD  Subjective:   PATIENT ID: Sergio Lawson GENDER: male DOB: [birth year], MRN: [id number]  Chief Complaint  Patient presents with   Follow-up    Review PET scan done 09/07/21. Breathing is unchanged since his last visit.     65yM with history of HBV, smoking 10 py who is referred for R hilar mass and mediastinal LAD.  Interval HPI: No cough, CP, hemoptysis, unintentional weight loss, drenching night sweats. Feeling overall well.  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  Otherwise pertinent review of systems is negative.  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[place])      Family History  Problem Relation Age of Onset   Cancer Mother      Past Surgical History:  Procedure Laterality Date   BRONCHIAL BIOPSY  05/17/2021   Procedure: BRONCHIAL BIOPSIES;  Surgeon: Marshell Garfinkel, MD;  Location: [place];  Service: Cardiopulmonary;;   BRONCHIAL BRUSHINGS  05/17/2021   Procedure: BRONCHIAL BRUSHINGS;  Surgeon: Marshell Garfinkel, MD;  Location: [place];  Service: Cardiopulmonary;;   BRONCHIAL WASHINGS  05/17/2021   Procedure: BRONCHIAL WASHINGS;  Surgeon: Marshell Garfinkel, MD;  Location: [facility];  Service: Cardiopulmonary;;   FINE NEEDLE ASPIRATION  05/17/2021   Procedure: FINE NEEDLE ASPIRATION (FNA) LINEAR;  Surgeon: Marshell Garfinkel, MD;  Location: [facility];  Service: Cardiopulmonary;;   LUMBAR [place] SURGERY  2011   x 2012   SPINE SURGERY  07/25/2009   herniated disc   VIDEO BRONCHOSCOPY WITH ENDOBRONCHIAL ULTRASOUND N/A 05/17/2021   Procedure: VIDEO BRONCHOSCOPY WITH ENDOBRONCHIAL ULTRASOUND;  Surgeon: Marshell Garfinkel, MD;  Location: [facility];  Service: Cardiopulmonary;  Laterality: N/A;    Social History   Socioeconomic History   Marital status: Single    Spouse name: Not on file   Number of children: Not on  file   Years of education: Not on file   Highest education level: Not on file  Occupational History   Not on file  Tobacco Use   Smoking status: Former    Packs/day: 0.50    Years: 10.00    Pack years: 5.00    Types: Cigarettes    Quit date: 07/25/2009    Years since quitting: 12.1   Smokeless tobacco: Never  Vaping Use   Vaping Use: Never used  Substance and Sexual Activity   Alcohol use: Yes    Alcohol/week: 1.0 standard drink    Types: 1 Cans of beer per week   Drug use: Never   Sexual activity: Not on file  Other Topics Concern   Not on file  Social History Narrative   Not on file   Social Determinants of Health   Financial Resource Strain: Not on file  Food Insecurity: Not on file  Transportation Needs: Not on file  Physical Activity: Not on file  Stress: Not on file  Social Connections: Not on file  Intimate Partner Violence: Not on file     Allergies  Allergen Reactions   Codeine Nausea Only   Levaquin [Levofloxacin] Nausea Only     Outpatient Medications Prior to Visit  Medication Sig Dispense Refill   acetaminophen (TYLENOL) 500 MG tablet Take 500-1,000 mg by mouth every 6 (six) hours as needed for mild pain.     umeclidinium-vilanterol (ANORO ELLIPTA) 62.5-25 MCG/ACT AEPB Inhale 1 puff into the lungs daily. (Patient not taking: Reported on  08/26/2021) 1 each 11   No facility-administered medications prior to visit.       Objective:   Physical Exam:  General appearance: 66 y.o., male, NAD, conversant  Eyes: anicteric sclerae; PERRL, tracking appropriately HENT: NCAT; MMM Neck: Trachea midline; no lymphadenopathy, no JVD Lungs: CTAB, no crackles, no wheeze, with normal respiratory effort CV: RRR, no murmur  Abdomen: Soft, non-tender; non-distended, BS present  Extremities: No peripheral edema, warm Skin: Normal turgor and texture; no rash Psych: Appropriate affect Neuro: Alert and oriented to person and place, no focal deficit     Vitals:    09/16/21 1408  BP: (!) 142/68  Pulse: 86  Temp: 98 F (36.7 C)  TempSrc: Oral  SpO2: 99%  Weight: 171 lb 3.2 oz (77.7 kg)  Height: _0  (1.626 m)     99% on RA BMI Readings from Last 3 Encounters:  09/16/21 29.39 kg/m  08/26/21 29.70 kg/m  05/27/21 29.11 kg/m   Wt Readings from Last 3 Encounters:  09/16/21 171 lb 3.2 oz (77.7 kg)  08/26/21 173 lb (78.5 kg)  05/27/21 169 lb 9.6 oz (76.9 kg)     CBC    Component Value Date/Time   WBC 5.5 04/27/2021 1253   RBC 5.58 04/27/2021 1253   HGB 14.1 04/27/2021 1253   HCT 44.8 04/27/2021 1253   PLT 197 04/27/2021 1253   MCV 80.3 04/27/2021 1253   MCH 25.3 (L) 04/27/2021 1253   MCHC 31.5 (L) 04/27/2021 1253   RDW 15.2 (H) 04/27/2021 1253   LYMPHSABS 1,711 04/27/2021 1253   EOSABS 209 04/27/2021 1253   BASOSABS 28 04/27/2021 1253    BAL resp cx with strep salivarius 2000 colonies/mL Bronchial wash cx negative  Cyto: 7S adequate, negative RML FNA neg (benign reparative changes), brushing benign reparative changes, RML bx negative  Chest Imaging: CT Chest 04/30/21 reviewed by me and remarkable for right hilar mass causing atelectasis of RML and inferior portion of RUL, mediastinal LAD  CXR 05/19/21 reviewed by me with pneumatocele and small effusion  CXR 05/27/21 reviewed by me with nearly completely resolved pneumatocele, small effusion and some progression of RML atelectasis  CT Chest 08/18/21 reviewed by me without substantial change except that RML lesion no longer appears heterogeneous, now looks more like atelectasis  PET/CT 09/07/21 with FDG avid mediastinal/hilar LAD, R hilar mass   Pulmonary Functions Testing Results: PFT Results Latest Ref Rng & Units 05/24/2021  FVC-Pre L 2.12  FVC-Post L 2.21  Pre FEV1/FVC % % 77  Post FEV1/FCV % % 77  FEV1-Pre L 1.63  FEV1-Post L 1.70  DLCO uncorrected ml/min/mmHg 16.96  DLCO UNC% % 77  DLCO corrected ml/min/mmHg 17.20  DLCO COR %Predicted % 78  DLVA Predicted %  124  TLC L 6.58  TLC % Predicted % 113  RV % Predicted % 211   Reviewed by me, significant for air trapping      Assessment & Plan:   # Right hilar mass: # FDG avid mediastinal LAD: Unclear etiology. Perhaps right hilar mass represents necrotizing pneumonia/abscess however there wasn't substantial change after course of ABX vs noninfectious inflammatory condition vs malignancy. I think there's decent chance however this may be explained by his metal dust exposure.   # DOE # History of smoking: # Air trapping: Trialed LABA/LAMA without significant response. He's not significantly bothered by DOE anyway.  Plan: - repeat EBUS will attempt to schedule 3/9 or so - check HIV, ANCAs, quantiferon, ACE - check coags given  trouble with hemoptysis after last bronch   RTC 4 weeks to discuss results     Maryjane Hurter, MD [facility] 09/16/2021 2:53 PM

## 2021-09-16 NOTE — H&P (View-Only) (Signed)
Synopsis: Referred for right hilar mass, mediastinal LAD by Elby Showers, MD  Subjective:   PATIENT ID: Sergio Lawson GENDER: male DOB: 1956-07-24, MRN: [id number]  Chief Complaint  Patient presents with   Follow-up    Review PET scan done 09/07/21. Breathing is unchanged since his last visit.     66yM with history of HBV, smoking 10 py who is referred for R hilar mass and mediastinal LAD.  Interval HPI: No cough, CP, hemoptysis, unintentional weight loss, drenching night sweats. Feeling overall well.  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  Otherwise pertinent review of systems is negative.  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[place])      Family History  Problem Relation Age of Onset   Cancer Mother      Past Surgical History:  Procedure Laterality Date   BRONCHIAL BIOPSY  05/17/2021   Procedure: BRONCHIAL BIOPSIES;  Surgeon: Marshell Garfinkel, MD;  Location: [facility];  Service: Cardiopulmonary;;   BRONCHIAL BRUSHINGS  05/17/2021   Procedure: BRONCHIAL BRUSHINGS;  Surgeon: Marshell Garfinkel, MD;  Location: [place];  Service: Cardiopulmonary;;   BRONCHIAL WASHINGS  05/17/2021   Procedure: BRONCHIAL WASHINGS;  Surgeon: Marshell Garfinkel, MD;  Location: [place];  Service: Cardiopulmonary;;   FINE NEEDLE ASPIRATION  05/17/2021   Procedure: FINE NEEDLE ASPIRATION (FNA) LINEAR;  Surgeon: Marshell Garfinkel, MD;  Location: [facility];  Service: Cardiopulmonary;;   LUMBAR [place] SURGERY  2011   x 2012   SPINE SURGERY  07/25/2009   herniated disc   VIDEO BRONCHOSCOPY WITH ENDOBRONCHIAL ULTRASOUND N/A 05/17/2021   Procedure: VIDEO BRONCHOSCOPY WITH ENDOBRONCHIAL ULTRASOUND;  Surgeon: Marshell Garfinkel, MD;  Location: [place];  Service: Cardiopulmonary;  Laterality: N/A;    Social History   Socioeconomic History   Marital status: Single    Spouse name: Not on file   Number of children: Not on  file   Years of education: Not on file   Highest education level: Not on file  Occupational History   Not on file  Tobacco Use   Smoking status: Former    Packs/day: 0.50    Years: 10.00    Pack years: 5.00    Types: Cigarettes    Quit date: 07/25/2009    Years since quitting: 12.1   Smokeless tobacco: Never  Vaping Use   Vaping Use: Never used  Substance and Sexual Activity   Alcohol use: Yes    Alcohol/week: 1.0 standard drink    Types: 1 Cans of beer per week   Drug use: Never   Sexual activity: Not on file  Other Topics Concern   Not on file  Social History Narrative   Not on file   Social Determinants of Health   Financial Resource Strain: Not on file  Food Insecurity: Not on file  Transportation Needs: Not on file  Physical Activity: Not on file  Stress: Not on file  Social Connections: Not on file  Intimate Partner Violence: Not on file     Allergies  Allergen Reactions   Codeine Nausea Only   Levaquin [Levofloxacin] Nausea Only     Outpatient Medications Prior to Visit  Medication Sig Dispense Refill   acetaminophen (TYLENOL) 500 MG tablet Take 500-1,000 mg by mouth every 6 (six) hours as needed for mild pain.     umeclidinium-vilanterol (ANORO ELLIPTA) 62.5-25 MCG/ACT AEPB Inhale 1 puff into the lungs daily. (Patient not taking: Reported on  08/26/2021) 1 each 11  ° °No facility-administered medications prior to visit.  ° ° ° ° ° °Objective:  ° °Physical Exam: ° °General appearance: 66 y.o., male, NAD, conversant, male, NAD, conversant  °Eyes: anicteric sclerae; PERRL, tracking appropriately °HENT: NCAT; MMM °Neck: Trachea midline; no lymphadenopathy, no JVD °Lungs: CTAB, no crackles, no wheeze, with normal respiratory effort °CV: RRR, no murmur  °Abdomen: Soft, non-tender; non-distended, BS present  °Extremities: No peripheral edema, warm °Skin: Normal turgor and texture; no rash °Psych: Appropriate affect °Neuro: Alert and oriented to person and place, no focal deficit  ° ° ° °Vitals:   ° 09/16/21 1408  °BP: (!) 142/68  °Pulse: 86  °Temp: 98 °F (36.7 °C)  °TempSrc: Oral  °SpO2: 99%  °Weight: 171 lb 3.2 oz (77.7 kg)  °Height: 5' 4" (1.626 m)  ° ° ° °99% on RA °BMI Readings from Last 3 Encounters:  °09/16/21 29.39 kg/m²  °08/26/21 29.70 kg/m²  °05/27/21 29.11 kg/m²  ° °Wt Readings from Last 3 Encounters:  °09/16/21 171 lb 3.2 oz (77.7 kg)  °08/26/21 173 lb (78.5 kg)  °05/27/21 169 lb 9.6 oz (76.9 kg)  ° ° ° °CBC °   °Component Value Date/Time  ° WBC 5.5 04/27/2021 1253  ° RBC 5.58 04/27/2021 1253  ° HGB 14.1 04/27/2021 1253  ° HCT 44.8 04/27/2021 1253  ° PLT 197 04/27/2021 1253  ° MCV 80.3 04/27/2021 1253  ° MCH 25.3 (L) 04/27/2021 1253  ° MCHC 31.5 (L) 04/27/2021 1253  ° RDW 15.2 (H) 04/27/2021 1253  ° LYMPHSABS 1,711 04/27/2021 1253  ° EOSABS 209 04/27/2021 1253  ° BASOSABS 28 04/27/2021 1253  ° ° °BAL resp cx with strep salivarius 2000 colonies/mL °Bronchial wash cx negative ° °Cyto: °7S adequate, negative °RML FNA neg (benign reparative changes), brushing benign reparative changes, RML bx negative ° °Chest Imaging: °CT Chest 04/30/21 reviewed by me and remarkable for right hilar mass causing atelectasis of RML and inferior portion of RUL, mediastinal LAD ° °CXR 05/19/21 reviewed by me with pneumatocele and small effusion ° °CXR 05/27/21 reviewed by me with nearly completely resolved pneumatocele, small effusion and some progression of RML atelectasis ° °CT Chest 08/18/21 reviewed by me without substantial change except that RML lesion no longer appears heterogeneous, now looks more like atelectasis ° °PET/CT 09/07/21 with FDG avid mediastinal/hilar LAD, R hilar mass  ° °Pulmonary Functions Testing Results: °PFT Results Latest Ref Rng & Units 05/24/2021  °FVC-Pre L 2.12  °FVC-Post L 2.21  °Pre FEV1/FVC % % 77  °Post FEV1/FCV % % 77  °FEV1-Pre L 1.63  °FEV1-Post L 1.70  °DLCO uncorrected ml/min/mmHg 16.96  °DLCO UNC% % 77  °DLCO corrected ml/min/mmHg 17.20  °DLCO COR %Predicted % 78  °DLVA Predicted %  124  °TLC L 6.58  °TLC % Predicted % 113  °RV % Predicted % 211  ° °Reviewed by me, significant for air trapping ° ° °   °Assessment & Plan:  ° °# Right hilar mass: °# FDG avid mediastinal LAD: °Unclear etiology. Perhaps right hilar mass represents necrotizing pneumonia/abscess however there wasn't substantial change after course of ABX vs noninfectious inflammatory condition vs malignancy. I think there's decent chance however this may be explained by his metal dust exposure.  ° °# DOE °# History of smoking: °# Air trapping: °Trialed LABA/LAMA without significant response. He's not significantly bothered by DOE anyway. ° °Plan: °- repeat EBUS will attempt to schedule 3/9 or so °- check HIV, ANCAs, quantiferon, ACE °- check coags given   trouble with hemoptysis after last bronch   RTC 4 weeks to discuss results     Maryjane Hurter, MD [facility] 09/16/2021 2:53 PM

## 2021-09-16 NOTE — Patient Instructions (Signed)
- Head to the lab to check bloodwork - We will call you to schedule EBUS/EBNA   Endobronchial Ultrasound Endobronchial ultrasound (EBUS) is a procedure done to check your lungs for inflammation, infection, or cancer. During this procedure, a thin and flexible scope (bronchoscope) is passed through your mouth and into one or both of your lungs. The bronchoscope has a video camera and an ultrasound probe on the end. The ultrasound probe uses sound waves to take imaging studies of your lung and the lymph nodes near your lung. The camera will send images of your lung to a video screen in the procedure room. A needle passed through the scope may be used to take lung tissue samples for evaluation and testing (needle biopsy). Tell a health care provider about: Any allergies you have. All medicines you are taking, including vitamins, herbs, eye drops, creams, and over-the-counter medicines. Any problems you or family members have had with anesthetic medicines. Any bleeding problems you have. Any surgeries you have had. Any medical conditions you have. Whether you are pregnant or may be pregnant. What are the risks? Generally, this is a safe procedure. However, problems may occur, including: Infection. Bleeding. Allergic reactions to medicines. Damage to nearby structures or organs. Lung collapse. What happens before the procedure? Medicines Ask your health care provider about: Changing or stopping your regular medicines. This is especially important if you are taking diabetes medicines or blood thinners. Taking medicines such as aspirin and ibuprofen. These medicines can thin your blood. Do not take these medicines unless your health care provider tells you to take them. Taking over-the-counter medicines, vitamins, herbs, and supplements. General instructions Follow instructions from your health care provider about eating and drinking, which may include having nothing to eat or drink after  midnight on the night before the procedure. If you will be going home right after the procedure, plan to have a responsible adult: Take you home from the hospital or clinic. You will not be allowed to drive. Care for you for the time you are told. What happens during the procedure? An IV will be inserted into one of your veins. You will be given one or more of the following: A medicine to help you relax (sedative). This may make you sleepy. A medicine to numb the area (local anesthetic). This medicine may be sprayed into your throat. It will make you feel more comfortable and keep you from gagging or coughing during the procedure. A medicine to make you fall asleep (general anesthetic). When you are sleepy and relaxed, the scope will be passed through your mouth, into your windpipe, and down into your lung or lungs. Your health care provider will view the video images from the camera to look at the inside of your airways. Ultrasound imaging studies may be done to get information about lung tissue or the lymph nodes near your lungs. A needle may be used to take tissue samples. When the procedure is over, the scope will be removed, and you will be taken to a recovery area. The procedure may vary among health care providers and hospitals. What can I expect after the procedure? Your blood pressure, heart rate, breathing rate, and blood oxygen level will be monitored until you leave the hospital or clinic. If a tissue sample was taken, it is up to you to get the results of your procedure. Ask your health care provider, or the department that is doing the procedure, when your results will be ready. In most cases, you  may go home after the procedure. After this procedure, it is common to have a sore throat and a slight cough. These should go away in 1-2 days. Follow these instructions at home: Medicines Take over-the-counter and prescription medicines only as told by your health care provider. If  you were given a sedative during the procedure, it can affect you for several hours. Do not drive or operate machinery until your health care provider says that it is safe. General instructions  Do not eat or drink anything until the numbing medicine (local anesthetic) has worn off and your gag reflex has returned. You will know that the local anesthetic has worn off when you can swallow comfortably. Do not use any products that contain nicotine or tobacco. These products include cigarettes, chewing tobacco, and vaping devices, such as e-cigarettes. If you need help quitting, ask your health care provider. Return to your normal activities as told by your health care provider. Ask your health care provider what activities are safe for you. Keep all follow-up visits. This is important. Contact a health care provider if: You have chills or a fever. You have a cough or sore throat that does not go away. You cough up blood. Get help right away if: You have chest pain or difficulty breathing. These symptoms may represent a serious problem that is an emergency. Do not wait to see if the symptoms will go away. Get medical help right away. Call your local emergency services (911 in the [place] Do not drive yourself to the hospital. Summary EBUS is done to check your lungs for inflammation, infection, or cancer. During this procedure, a bronchoscope is passed through your mouth and into one or both of your lungs. The bronchoscope has a video camera and an ultrasound probe on the end. The camera will send images of your lung to a video screen in the procedure room. A needle biopsy may also be done during the procedure. In most cases, you may go home after the procedure. This information is not intended to replace advice given to you by your health care provider. Make sure you discuss any questions you have with your health care provider. Document Revised: 01/05/2021 Document Reviewed: 01/05/2021 Elsevier  Patient Education  2022 [employer].

## 2021-09-16 NOTE — Telephone Encounter (Signed)
Please schedule the following:  Provider performing procedure:Nathan Jenascia Bumpass Diagnosis: mediastinal lymphadenopathy Which side if for nodule / mass? N/a Procedure: EBUS  Has patient been spoken to by Provider and given informed consent? Yes Anesthesia: General Do you need Fluro? No Duration of procedure: 1 hour Date: 3/9 preferred Alternate Date: 3/14  Time: PM preferred Location: [facility] Does patient have OSA? No DM? No Or Latex allergy? No Medication Restriction: None Anticoagulate/Antiplatelet: None Pre-op Labs Ordered:determined by Anesthesia Imaging request: None  (If, SuperDimension CT Chest, please have STAT courier sent to ENDO)  Please coordinate Pre-op COVID Testing

## 2021-09-19 LAB — QUANTIFERON-TB GOLD PLUS
Mitogen-NIL: >10 IU/mL
NIL: 0.1 IU/mL
QuantiFERON-TB Gold Plus: NEGATIVE
TB1-NIL: 0.07 IU/mL
TB2-NIL: 0.25 IU/mL

## 2021-09-19 LAB — ANGIOTENSIN CONVERTING ENZYME: Angiotensin-Converting Enzyme: 52 U/L (ref 9–67)

## 2021-09-19 LAB — ANCA SCREEN W REFLEX TITER: ANCA SCREEN: NEGATIVE

## 2021-09-19 LAB — HIV ANTIBODY (ROUTINE TESTING W REFLEX): HIV 1&2 Ab, 4th Generation: NONREACTIVE

## 2021-09-21 ENCOUNTER — Other Ambulatory Visit: Payer: Self-pay | Admitting: Student

## 2021-09-21 ENCOUNTER — Encounter: Payer: Self-pay | Admitting: Student

## 2021-09-21 NOTE — Telephone Encounter (Addendum)
Pt could not be scheduled at [facility] due to this will be outpt.  Dr Thora Lance has ok'd [facility].  Pt scheduled for 3/9 at 1:30 and will go for covid test on 3/6.  I called pt and gave him appt info over the phone,  He is driving to [place].  I asked if I could call him back later today or tomorrow.  He looks at his MyChart and he said he would call if he has questions.  I told him I would send him a letter thru MyChart with appt info and gave him my name and told him if he has any questions to call the office and ask for me.  He states ok.

## 2021-09-22 ENCOUNTER — Encounter (HOSPITAL_COMMUNITY): Payer: Self-pay | Admitting: Student

## 2021-09-23 ENCOUNTER — Ambulatory Visit: Payer: Medicare Other | Admitting: Student

## 2021-09-27 ENCOUNTER — Other Ambulatory Visit: Payer: Self-pay | Admitting: Student

## 2021-09-27 LAB — SARS CORONAVIRUS 2 (TAT 6-24 HRS): SARS Coronavirus 2: NEGATIVE

## 2021-09-30 ENCOUNTER — Other Ambulatory Visit: Payer: Self-pay

## 2021-09-30 ENCOUNTER — Encounter (HOSPITAL_COMMUNITY): Payer: Self-pay | Admitting: Student

## 2021-09-30 ENCOUNTER — Ambulatory Visit (HOSPITAL_COMMUNITY)
Admission: RE | Admit: 2021-09-30 | Discharge: 2021-09-30 | Disposition: A | Discharge status: Home / Self Care | Payer: Medicare Other | Source: Ambulatory Visit | Attending: Student | Admitting: Student

## 2021-09-30 ENCOUNTER — Ambulatory Visit (HOSPITAL_COMMUNITY): Payer: Medicare Other | Admitting: Anesthesiology

## 2021-09-30 ENCOUNTER — Encounter (HOSPITAL_COMMUNITY)
Admission: RE | Disposition: A | Discharge status: Home / Self Care | Payer: Self-pay | Source: Ambulatory Visit | Attending: Student

## 2021-09-30 ENCOUNTER — Ambulatory Visit (HOSPITAL_BASED_OUTPATIENT_CLINIC_OR_DEPARTMENT_OTHER): Payer: Medicare Other | Admitting: Anesthesiology

## 2021-09-30 DIAGNOSIS — R590 Localized enlarged lymph nodes: Secondary | ICD-10-CM | POA: Diagnosis not present

## 2021-09-30 DIAGNOSIS — R918 Other nonspecific abnormal finding of lung field: Secondary | ICD-10-CM | POA: Insufficient documentation

## 2021-09-30 DIAGNOSIS — R0609 Other forms of dyspnea: Secondary | ICD-10-CM | POA: Insufficient documentation

## 2021-09-30 DIAGNOSIS — B181 Chronic viral hepatitis B without delta-agent: Secondary | ICD-10-CM | POA: Insufficient documentation

## 2021-09-30 DIAGNOSIS — Z87891 Personal history of nicotine dependence: Secondary | ICD-10-CM | POA: Insufficient documentation

## 2021-09-30 HISTORY — PX: BRONCHIAL NEEDLE ASPIRATION BIOPSY: SHX5106

## 2021-09-30 HISTORY — PX: VIDEO BRONCHOSCOPY: SHX5072

## 2021-09-30 HISTORY — PX: ENDOBRONCHIAL ULTRASOUND: SHX5096

## 2021-09-30 SURGERY — ENDOBRONCHIAL ULTRASOUND (EBUS)
Anesthesia: General

## 2021-09-30 MED ORDER — SUCCINYLCHOLINE CHLORIDE 200 MG/10ML IV SOSY
PREFILLED_SYRINGE | INTRAVENOUS | Status: DC | PRN
  Administered 2021-09-30: 100 mg via INTRAVENOUS

## 2021-09-30 MED ORDER — EPHEDRINE SULFATE (PRESSORS) 50 MG/ML IJ SOLN
INTRAMUSCULAR | Status: DC | PRN
  Administered 2021-09-30: 10 mg via INTRAVENOUS
  Administered 2021-09-30: 15 mg via INTRAVENOUS

## 2021-09-30 MED ORDER — ONDANSETRON HCL 4 MG/2ML IJ SOLN
INTRAMUSCULAR | Status: DC | PRN
Start: 2021-09-30 — End: 2021-09-30
  Administered 2021-09-30: 4 mg via INTRAVENOUS

## 2021-09-30 MED ORDER — DEXAMETHASONE SODIUM PHOSPHATE 10 MG/ML IJ SOLN
INTRAMUSCULAR | Status: DC | PRN
  Administered 2021-09-30: 10 mg via INTRAVENOUS

## 2021-09-30 MED ORDER — PROPOFOL 10 MG/ML IV BOLUS
INTRAVENOUS | Status: DC | PRN
  Administered 2021-09-30: 160 mg via INTRAVENOUS

## 2021-09-30 MED ORDER — LIDOCAINE 2% (20 MG/ML) 5 ML SYRINGE
INTRAMUSCULAR | Status: DC | PRN
  Administered 2021-09-30: 60 mg via INTRAVENOUS

## 2021-09-30 MED ORDER — FENTANYL CITRATE (PF) 100 MCG/2ML IJ SOLN
INTRAMUSCULAR | Status: DC | PRN
  Administered 2021-09-30: 100 ug via INTRAVENOUS

## 2021-09-30 MED ORDER — FENTANYL CITRATE (PF) 100 MCG/2ML IJ SOLN
INTRAMUSCULAR | Status: AC
  Filled 2021-09-30: qty 2

## 2021-09-30 MED ORDER — PHENYLEPHRINE 40 MCG/ML (10ML) SYRINGE FOR IV PUSH (FOR BLOOD PRESSURE SUPPORT)
PREFILLED_SYRINGE | INTRAVENOUS | Status: DC | PRN
  Administered 2021-09-30: 120 ug via INTRAVENOUS
  Administered 2021-09-30 (×2): 80 ug via INTRAVENOUS

## 2021-09-30 MED ORDER — SUGAMMADEX SODIUM 200 MG/2ML IV SOLN
INTRAVENOUS | Status: DC | PRN
Start: 2021-09-30 — End: 2021-09-30
  Administered 2021-09-30: 200 mg via INTRAVENOUS

## 2021-09-30 MED ORDER — LACTATED RINGERS IV SOLN: INTRAVENOUS | Status: DC | PRN

## 2021-09-30 MED ORDER — ROCURONIUM BROMIDE 10 MG/ML (PF) SYRINGE
PREFILLED_SYRINGE | INTRAVENOUS | Status: DC | PRN
  Administered 2021-09-30: 20 mg via INTRAVENOUS

## 2021-09-30 NOTE — Anesthesia Preprocedure Evaluation (Signed)
Anesthesia Evaluation  ?Patient identified by MRN, date of birth, ID band ?Patient awake ? ? ? ?Reviewed: ?Allergy & Precautions, NPO status , Patient's Chart, lab work & pertinent test results ? ?Airway ?Mallampati: II ? ?TM Distance: >3 FB ?Neck ROM: Full ? ? ? Dental ?no notable dental hx. ? ?  ?Pulmonary ?neg pulmonary ROS, former smoker,  ?  ?Pulmonary exam normal ?breath sounds clear to auscultation ? ? ? ? ? ? Cardiovascular ?negative cardio ROS ?Normal cardiovascular exam ?Rhythm:Regular Rate:Normal ? ? ?  ?Neuro/Psych ?negative neurological ROS ? negative psych ROS  ? GI/Hepatic ?negative GI ROS, (+) Hepatitis -, B  ?Endo/Other  ?negative endocrine ROS ? Renal/GU ?negative Renal ROS  ?negative genitourinary ?  ?Musculoskeletal ?negative musculoskeletal ROS ?(+)  ? Abdominal ?  ?Peds ?negative pediatric ROS ?(+)  Hematology ?negative hematology ROS ?(+)   ?Anesthesia Other Findings ? ? Reproductive/Obstetrics ?negative OB ROS ? ?  ? ? ? ? ? ? ? ? ? ? ? ? ? ?  ?  ? ? ? ? ? ? ? ? ?Anesthesia Physical ?Anesthesia Plan ? ?ASA: 2 ? ?Anesthesia Plan: General  ? ?Post-op Pain Management: Minimal or no pain anticipated  ? ?Induction: Intravenous ? ?PONV Risk Score and Plan: 2 and Ondansetron, Dexamethasone and Treatment may vary due to age or medical condition ? ?Airway Management Planned: Oral ETT ? ?Additional Equipment:  ? ?Intra-op Plan:  ? ?Post-operative Plan: Extubation in OR ? ?Informed Consent: I have reviewed the patients History and Physical, chart, labs and discussed the procedure including the risks, benefits and alternatives for the proposed anesthesia with the patient or authorized representative who has indicated his/her understanding and acceptance.  ? ? ? ?Dental advisory given ? ?Plan Discussed with: CRNA and Surgeon ? ?Anesthesia Plan Comments:   ? ? ? ? ? ? ?Anesthesia Quick Evaluation ? ?

## 2021-09-30 NOTE — Op Note (Signed)
Flexible and EBUS Bronchoscopy Procedure Note ? ?Sergio Lawson  ?[id number]  ?11-Mar-1956 ? ?Date:09/30/21  ?Time:2:59 PM  ? ?Provider Performing:Marrisa Kimber Merlene Pulling  ? ?Procedure: Flexible bronchoscopy and EBUS Bronchoscopy ? ?Indication(s) ?Hypermetabolic mediastinal lymphadenopathy and right hilar mass ? ?Consent ?Risks of the procedure as well as the alternatives and risks of each were explained to the patient and/or caregiver.  Consent for the procedure was obtained. ? ?Anesthesia ?General Anesthesia ? ? ?Time Out ?Verified patient identification, verified procedure, site/side was marked, verified correct patient position, special equipment/implants available, medications/allergies/relevant history reviewed, required imaging and test results available. ? ? ?Sterile Technique ?Usual hand hygiene, masks, gowns, and gloves were used ? ? ?Procedure Description ?Diagnostic bronchoscope advanced through endotracheal tube and into airway.  Airways were examined down to subsegmental level with findings noted below. The diagnostic bronchoscope was then removed and the EBUS bronchoscope was advanced into airway with stations 4R and 7S biopsied and sent for slide, cell block, and/or culture.  The EBUS bronchoscope was removed after assuring no active bleeding from biopsy site. ? ?Findings:  ?- extrinsically compressed RML takeoff  ?- anthracotic pigmentation RUL and RML takeoff ?- EBNA of 7S with 11 passes, 4R with 8 passes ? ? ?Complications/Tolerance ?None; patient tolerated the procedure well. ?Chest X-ray is not needed post procedure. ? ? ?EBL ?Minimal ? ? ?Specimen(s) ?EBNA sent for micro, cyto  ?

## 2021-09-30 NOTE — Anesthesia Postprocedure Evaluation (Signed)
Anesthesia Post Note ? ?Patient: Sergio Lawson ? ?Procedure(s) Performed: ENDOBRONCHIAL ULTRASOUND ?VIDEO BRONCHOSCOPY WITHOUT FLUORO ?BRONCHIAL NEEDLE ASPIRATION BIOPSIES ? ?  ? ?Patient location during evaluation: PACU ?Anesthesia Type: General ?Level of consciousness: awake and alert ?Pain management: pain level controlled ?Vital Signs Assessment: post-procedure vital signs reviewed and stable ?Respiratory status: spontaneous breathing, nonlabored ventilation, respiratory function stable and patient connected to nasal cannula oxygen ?Cardiovascular status: blood pressure returned to baseline and stable ?Postop Assessment: no apparent nausea or vomiting ?Anesthetic complications: no ? ? ?No notable events documented. ? ?Last Vitals:  ?Vitals:  ? 09/30/21 1545 09/30/21 1550  ?BP:  (!) 160/99  ?Pulse: 70 66  ?Resp: (!) 9 19  ?Temp:    ?SpO2: 99% 97%  ?  ?Last Pain:  ?Vitals:  ? 09/30/21 1544  ?TempSrc:   ?PainSc: 0-No pain  ? ? ?  ?  ?  ?  ?  ?  ? ?Nelle Don Daman Steffenhagen ? ? ? ? ?

## 2021-09-30 NOTE — Anesthesia Procedure Notes (Signed)
Procedure Name: Intubation ?Date/Time: 09/30/2021 1:52 PM ?Performed by: Gean Maidens, CRNA ?Patient Re-evaluated:Patient Re-evaluated prior to induction ?Oxygen Delivery Method: Circle system utilized ?Preoxygenation: Pre-oxygenation with 100% oxygen ?Induction Type: IV induction ?Ventilation: Mask ventilation without difficulty ?Laryngoscope Size: Mac and 4 ?Grade View: Grade I ?Tube type: Oral ?Tube size: 8.5 mm ?Number of attempts: 1 ?Airway Equipment and Method: Stylet ?Placement Confirmation: ETT inserted through vocal cords under direct vision, positive ETCO2 and breath sounds checked- equal and bilateral ?Secured at: 23 cm ?Tube secured with: Tape ?Dental Injury: Teeth and Oropharynx as per pre-operative assessment  ? ? ? ? ?

## 2021-09-30 NOTE — Transfer of Care (Signed)
Immediate Anesthesia Transfer of Care Note ? ?Patient: Sergio Lawson ? ?Procedure(s) Performed: ENDOBRONCHIAL ULTRASOUND ?VIDEO BRONCHOSCOPY WITHOUT FLUORO ?BRONCHIAL NEEDLE ASPIRATION BIOPSIES ? ?Patient Location: PACU ? ?Anesthesia Type:General ? ?Level of Consciousness: sedated, patient cooperative and responds to stimulation ? ?Airway & Oxygen Therapy: Patient Spontanous Breathing and Patient connected to face mask oxygen ? ?Post-op Assessment: Report given to RN and Post -op Vital signs reviewed and stable ? ?Post vital signs: Reviewed and stable ? ?Last Vitals:  ?Vitals Value Taken Time  ?BP    ?Temp    ?Pulse 74 09/30/21 1510  ?Resp 14 09/30/21 1510  ?SpO2 100 % 09/30/21 1510  ?Vitals shown include unvalidated device data. ? ?Last Pain:  ?Vitals:  ? 09/30/21 1204  ?TempSrc: Oral  ?PainSc: 0-No pain  ?   ? ?  ? ?Complications: No notable events documented. ?

## 2021-09-30 NOTE — Discharge Instructions (Signed)
Coughing up small clots or bloody streaks for the next couple of days is normal. Call [phone] or send mychart message if filling up half a cup with frankly bloody phlegm - this would be so much that we may need to think about sending you to the emergency department or directly admitting you to the hospital. ?

## 2021-09-30 NOTE — Interval H&P Note (Signed)
History and Physical Interval Note: ? ?09/30/2021 ?1:31 PM ? ?Sergio Lawson  has presented today for surgery, with the diagnosis of MEDIASTINAL LYMPHADENOPATHAY.  The various methods of treatment have been discussed with the patient and family. After consideration of risks, benefits and other options for treatment, the patient has consented to  Procedure(s): ?ENDOBRONCHIAL ULTRASOUND (N/A) as a surgical intervention.  The patient's history has been reviewed, patient examined, no change in status, stable for surgery.  I have reviewed the patient's chart and labs.  Questions were answered to the patient's satisfaction.   ? ? ?Omar Person ? ? ?

## 2021-10-02 LAB — ACID FAST SMEAR (AFB, MYCOBACTERIA): Acid Fast Smear: NEGATIVE

## 2021-10-04 ENCOUNTER — Encounter (HOSPITAL_COMMUNITY): Payer: Self-pay | Admitting: Student

## 2021-10-04 LAB — CYTOLOGY - NON PAP

## 2021-10-06 ENCOUNTER — Telehealth: Payer: Self-pay | Admitting: Student

## 2021-10-06 NOTE — Telephone Encounter (Signed)
Called patient and he stated that he had a procedure done early this month and he is wanting to know if we had those results yet. I do not see anything in the chart about a procedure that was done. He said he got a return phone call about results. I told patient to would reach out to Dr Thora Lance and see if we can find out anything. ? ?Dr Thora Lance please advise  ?

## 2021-11-01 LAB — FUNGUS CULTURE RESULT

## 2021-11-01 LAB — FUNGUS CULTURE WITH STAIN

## 2021-11-01 LAB — FUNGAL ORGANISM REFLEX

## 2021-11-14 LAB — ACID FAST CULTURE WITH REFLEXED SENSITIVITIES (MYCOBACTERIA): Acid Fast Culture: NEGATIVE

## 2022-04-11 ENCOUNTER — Telehealth: Payer: Self-pay | Admitting: Student

## 2022-04-11 DIAGNOSIS — R590 Localized enlarged lymph nodes: Secondary | ICD-10-CM

## 2022-04-11 NOTE — Telephone Encounter (Signed)
-----   Message from Marshell Garfinkel, MD sent at 04/11/2022 10:15 AM EDT ----- Regarding: FW: Follow up Looks like he called and cancelled his last visit but did not reschedule  ----- Message ----- From: Marshell Garfinkel, MD Sent: 04/11/2022  10:14 AM EDT To: Maryjane Hurter, MD Subject: Follow up                                      Hi Nate,  He does not have a follow up imaging or clinic visit. Do you want to arrange it? Thanks  [employer]

## 2022-04-11 NOTE — Telephone Encounter (Signed)
I've placed order for surveillance CT Chest. Can we try to schedule him in my clinic, next available?  Thanks!

## 2022-04-12 NOTE — Telephone Encounter (Signed)
Patient is scheduled 05/04/2022 at 3pm with Dr. Verlee Monte- reminder mailed to patient- nothing further needed.

## 2022-04-17 ENCOUNTER — Ambulatory Visit (HOSPITAL_BASED_OUTPATIENT_CLINIC_OR_DEPARTMENT_OTHER)
Admission: RE | Admit: 2022-04-17 | Discharge: 2022-04-17 | Disposition: A | Discharge status: Home / Self Care | Payer: Medicare Other | Source: Ambulatory Visit | Attending: Student | Admitting: Student

## 2022-04-17 DIAGNOSIS — R590 Localized enlarged lymph nodes: Secondary | ICD-10-CM | POA: Diagnosis not present

## 2022-04-30 NOTE — Progress Notes (Unsigned)
Synopsis: Referred for right hilar mass, mediastinal LAD by Elby Showers, MD  Subjective:   PATIENT ID: Sergio Lawson GENDER: male DOB: 1956/02/26, MRN: [id number]  No chief complaint on file.   66yM with history of HBV, smoking 10 py who is referred for R hilar mass and mediastinal LAD.  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  Interval HPI: No cough, CP, hemoptysis, unintentional weight loss, drenching night sweats, feeling overall well.   Otherwise pertinent review of systems is negative.  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[place])      Family History  Problem Relation Age of Onset   Cancer Mother      Past Surgical History:  Procedure Laterality Date   BRONCHIAL BIOPSY  05/17/2021   Procedure: BRONCHIAL BIOPSIES;  Surgeon: Marshell Garfinkel, MD;  Location: [place];  Service: Cardiopulmonary;;   BRONCHIAL BRUSHINGS  05/17/2021   Procedure: BRONCHIAL BRUSHINGS;  Surgeon: Marshell Garfinkel, MD;  Location: [place];  Service: Cardiopulmonary;;   BRONCHIAL NEEDLE ASPIRATION BIOPSY  09/30/2021   Procedure: BRONCHIAL NEEDLE ASPIRATION BIOPSIES;  Surgeon: Maryjane Hurter, MD;  Location: [facility];  Service: Pulmonary;;   BRONCHIAL WASHINGS  05/17/2021   Procedure: BRONCHIAL WASHINGS;  Surgeon: Marshell Garfinkel, MD;  Location: [place];  Service: Cardiopulmonary;;   ENDOBRONCHIAL ULTRASOUND N/A 09/30/2021   Procedure: ENDOBRONCHIAL ULTRASOUND;  Surgeon: Maryjane Hurter, MD;  Location: [facility];  Service: Pulmonary;  Laterality: N/A;   FINE NEEDLE ASPIRATION  05/17/2021   Procedure: FINE NEEDLE ASPIRATION (FNA) LINEAR;  Surgeon: Marshell Garfinkel, MD;  Location: [place] [facility];  Service: Cardiopulmonary;;   LUMBAR [place] SURGERY  2011   x 2012   SPINE SURGERY  07/25/2009   herniated disc   VIDEO BRONCHOSCOPY  09/30/2021   Procedure: VIDEO BRONCHOSCOPY WITHOUT FLUORO;  Surgeon: Maryjane Hurter, MD;  Location: [facility];  Service: Pulmonary;;   VIDEO BRONCHOSCOPY WITH ENDOBRONCHIAL ULTRASOUND N/A 05/17/2021   Procedure: VIDEO BRONCHOSCOPY WITH ENDOBRONCHIAL ULTRASOUND;  Surgeon: Marshell Garfinkel, MD;  Location: [place];  Service: Cardiopulmonary;  Laterality: N/A;    Social History   Socioeconomic History   Marital status: Single    Spouse name: Not on file   Number of children: Not on file   Years of education: Not on file   Highest education level: Not on file  Occupational History   Not on file  Tobacco Use   Smoking status: Former    Packs/day: 0.50    Years: 10.00    Total pack years: 5.00    Types: Cigarettes    Quit date: 07/25/2009    Years since quitting: 12.7   Smokeless tobacco: Never  Vaping Use   Vaping Use: Never used  Substance and Sexual Activity   Alcohol use: Yes    Alcohol/week: 1.0 standard drink of alcohol    Types: 1 Cans of beer per week   Drug use: Never   Sexual activity: Not on file  Other Topics Concern   Not on file  Social History Narrative   Not on file   Social Determinants of Health   Financial Resource Strain: Not on file  Food Insecurity: Not on file  Transportation Needs: Not on file  Physical Activity: Not on file  Stress: Not on file  Social Connections: Not on file  Intimate Partner Violence: Not on file     Allergies  Allergen Reactions   Codeine Nausea  Only   Levaquin [Levofloxacin] Nausea Only     Outpatient Medications Prior to Visit  Medication Sig Dispense Refill   acetaminophen (TYLENOL) 500 MG tablet Take 500-1,000 mg by mouth every 6 (six) hours as needed for mild pain.     No facility-administered medications prior to visit.       Objective:   Physical Exam:  General appearance: 66 y.o., male, NAD, conversant  Eyes: anicteric sclerae; PERRL, tracking appropriately HENT: NCAT; MMM Neck: Trachea midline; no lymphadenopathy, no JVD Lungs: CTAB, no crackles, no wheeze, with normal  respiratory effort CV: RRR, no murmur  Abdomen: Soft, non-tender; non-distended, BS present  Extremities: No peripheral edema, warm Skin: Normal turgor and texture; no rash Psych: Appropriate affect Neuro: Alert and oriented to person and place, no focal deficit     There were no vitals filed for this visit.      on RA BMI Readings from Last 3 Encounters:  09/30/21 29.40 kg/m  09/16/21 29.39 kg/m  08/26/21 29.70 kg/m   Wt Readings from Last 3 Encounters:  09/30/21 171 lb 4.8 oz (77.7 kg)  09/16/21 171 lb 3.2 oz (77.7 kg)  08/26/21 173 lb (78.5 kg)     CBC    Component Value Date/Time   WBC 4.8 09/16/2021 1508   RBC 5.39 09/16/2021 1508   HGB 13.2 09/16/2021 1508   HCT 41.6 09/16/2021 1508   PLT 172.0 09/16/2021 1508   MCV 77.1 (L) 09/16/2021 1508   MCH 25.3 (L) 04/27/2021 1253   MCHC 31.8 09/16/2021 1508   RDW 15.6 (H) 09/16/2021 1508   LYMPHSABS 1.1 09/16/2021 1508   MONOABS 0.6 09/16/2021 1508   EOSABS 0.2 09/16/2021 1508   BASOSABS 0.0 09/16/2021 1508    BAL resp cx with strep salivarius 2000 colonies/mL Bronchial wash cx negative  Cyto: 7S adequate, negative RML FNA neg (benign reparative changes), brushing benign reparative changes, RML bx negative  Chest Imaging: CT Chest 04/30/21 reviewed by me and remarkable for right hilar mass causing atelectasis of RML and inferior portion of RUL, mediastinal LAD  CXR 05/19/21 reviewed by me with pneumatocele and small effusion  CXR 05/27/21 reviewed by me with nearly completely resolved pneumatocele, small effusion and some progression of RML atelectasis  CT Chest 08/18/21 reviewed by me without substantial change except that RML lesion no longer appears heterogeneous, now looks more like atelectasis  PET/CT 09/07/21 with FDG avid mediastinal/hilar LAD, R hilar mass   CT Chest 04/17/22 reviewed by me with stable med/hilar LAD, R hilar collapse  Pulmonary Functions Testing Results:    Latest Ref Rng & Units  05/24/2021    2:46 PM  PFT Results  FVC-Pre L 2.12   FVC-Post L 2.21   Pre FEV1/FVC % % 77   Post FEV1/FCV % % 77   FEV1-Pre L 1.63   FEV1-Post L 1.70   DLCO uncorrected ml/min/mmHg 16.96   DLCO UNC% % 77   DLCO corrected ml/min/mmHg 17.20   DLCO COR %Predicted % 78   DLVA Predicted % 124   TLC L 6.58   TLC % Predicted % 113   RV % Predicted % 211    Reviewed by me, significant for air trapping      Assessment & Plan:   # Right hilar mass: # FDG avid mediastinal LAD: Unclear etiology. Perhaps right hilar mass represents necrotizing pneumonia/abscess however there wasn't substantial change after course of ABX vs noninfectious inflammatory condition vs malignancy. I think there's decent chance however this  may be explained by his metal dust exposure.   # DOE # History of smoking: # Air trapping: Trialed LABA/LAMA without significant response. He's not significantly bothered by DOE anyway.  Plan: - repeat EBUS will attempt to schedule 3/9 or so - check HIV, ANCAs, quantiferon, ACE - check coags given trouble with hemoptysis after last bronch   RTC 4 weeks to discuss results     Maryjane Hurter, MD [facility] 04/30/2022 1:03 PM

## 2022-05-04 ENCOUNTER — Ambulatory Visit (INDEPENDENT_AMBULATORY_CARE_PROVIDER_SITE_OTHER): Payer: Medicare Other | Admitting: Student

## 2022-05-04 ENCOUNTER — Encounter: Payer: Self-pay | Admitting: Student

## 2022-05-04 VITALS — BP 140/90 | HR 72 | Temp 98.0°F | Ht 64.0 in | Wt 172.6 lb

## 2022-05-04 DIAGNOSIS — Z23 Encounter for immunization: Secondary | ICD-10-CM | POA: Diagnosis not present

## 2022-05-04 DIAGNOSIS — R590 Localized enlarged lymph nodes: Secondary | ICD-10-CM

## 2022-05-04 NOTE — Patient Instructions (Signed)
-   CT Chest in March 2024 and clinic visit afterward

## 2022-09-23 ENCOUNTER — Ambulatory Visit
Admission: RE | Admit: 2022-09-23 | Discharge: 2022-09-23 | Disposition: A | Discharge status: Home / Self Care | Payer: No Typology Code available for payment source | Source: Ambulatory Visit | Attending: Student | Admitting: Student

## 2022-09-23 DIAGNOSIS — I700 Atherosclerosis of aorta: Secondary | ICD-10-CM | POA: Diagnosis not present

## 2022-09-23 DIAGNOSIS — R590 Localized enlarged lymph nodes: Secondary | ICD-10-CM

## 2022-10-10 ENCOUNTER — Ambulatory Visit (INDEPENDENT_AMBULATORY_CARE_PROVIDER_SITE_OTHER): Payer: No Typology Code available for payment source | Admitting: Nurse Practitioner

## 2022-10-10 ENCOUNTER — Encounter: Payer: Self-pay | Admitting: Nurse Practitioner

## 2022-10-10 VITALS — BP 138/80 | HR 81 | Temp 98.0°F | Ht 64.0 in | Wt 174.2 lb

## 2022-10-10 DIAGNOSIS — R918 Other nonspecific abnormal finding of lung field: Secondary | ICD-10-CM

## 2022-10-10 DIAGNOSIS — R590 Localized enlarged lymph nodes: Secondary | ICD-10-CM

## 2022-10-10 NOTE — Assessment & Plan Note (Signed)
See above

## 2022-10-10 NOTE — Progress Notes (Signed)
@Patient  ID: [place], male    DOB: 1956-02-16, 67 y.o.   MRN: [id number]  Chief Complaint  Patient presents with   Follow-up    CT result's 09/23/2022    Referring provider: Elby Showers, MD  HPI: 67 year old male, former smoker followed for mediastinal lymphadenopathy and hilar mass.  He is a patient of Dr. Glenetta Hew and last seen in office 05/04/2022.  Past medical history significant for HBV.  TEST/EVENTS:  05/17/2021 EBUS: Streptococcus salivarius, cytology negative 09/07/2021 PET scan: Right middle lobe hypermetabolic nodule with SUV max 8.3.  Hypermetabolic nodularity in the collapsed right middle lobe.  Hypermetabolic mediastinal adenopathy.  Small hypermetabolic left infrahilar lymph nodes and a hypermetabolic focus along the right anterior pleura.  No findings of metastatic disease elsewhere. 09/16/2021: HIV negative, ANCA negative, Quantiferon Gold neg, normal ACE 09/30/2021 EBUS: No growth, cytology negative 04/17/2022 CT chest: Stable mediastinal LAD.  Cut off of the right middle lobe bronchus with complete collapse of the right middle lobe.  Findings are unchanged.  No new discrete pulmonary nodule. 09/25/2022 CT chest without contrast: Mild atherosclerosis.  Stable mediastinal lymph nodes, including a 1.1 cm right paratracheal node and a 1 cm subcarinal node.  No progressive thoracic adenopathy.  Hilar contours appear unchanged.  Unchanged complete right middle lobe collapse with caudal off of the proximal right middle lobe bronchus.  Lungs are otherwise clear without suspicious pulmonary nodularity.   05/27/2021: OV with Dr. Verlee Monte.  Underwent EBUS with EBNA of 7S, right hilar mass, EBBx/brushing right hilar mass, BAL which was revealing only for strep salivarius from BAL.  He did have a pneumatocele post procedurally with likely related small pleural effusion and some hemoptysis.  He was treated with cefpodoxime and his productive cough and hemoptysis have improved.  He has some DOE  which is unchanged prior to procedure.  Unclear etiology of right hilar mass and mediastinal LAD.  Perhaps represents necrotizing pneumonia/abscess versus noninfectious inflammatory condition versus malignancy.  Awaiting AFB cultures from bronc.  Cytology was negative.  Plan to repeat CT imaging in 8 weeks.  He did not have any evidence of obstruction on previous PFTs but did have some air trapping.  Will trial him on Anoro.   08/26/2021: OV with Dr. Verlee Monte.  No substantial radiographic change after 4-week course of cefpodoxime in setting of possible strep salivarius abscess/lymphadenitis.  Feeling good overall.  No cough at all.  Hemoptysis has resolved.  Sometimes feels a pulling sensation or right lower CP when he takes a deep breath.  Weight is unchanged.  No fever, night sweats.  Etiology remains unclear.  Perhaps represents necrotizing pneumonia/abscess however there was not substantial change after course of antibiotics.  Possible this is a noninfectious inflammatory condition versus malignancy.  Plan to obtain PET/CT for further evaluation.  He tried Anoro without any significant response.  Not significantly bothered by his DOE anyway.  Decision was made to discontinue this.  09/16/2021: OV with Dr. Verlee Monte.  No cough, chest pain, hemoptysis, unintentional weight loss, drenching night sweats.  Feeling well overall.  Interestingly he did work for 25 years around [employer] for [employer]  His PET scan revealed FDG avid mediastinal LAD.  Felt as though there was decent chance that his mass and LAD was related to occupational metal dust exposure; however, findings warranted further workup.  Repeat EBUS.  HIV, ANCA's, QuantiFERON and ACE were all negative.    05/04/2022: OV with Dr. Verlee Monte.  Intermittent cough which  she attributes to the weather but no chest pain, hemoptysis, unintentional weight loss, night sweats.  Still feeling well overall.  He does not have related symptoms currently.   Felt as though his mass was likely related to metal dust exposure.  Plan for repeat CT chest in 6 months with plans to follow for stability tentatively until at least 04/2023.  10/10/2022: Today-follow-up Patient presents today for follow-up after undergoing repeat CT of the chest which was stable compared to last September 2023.  Hilar mass has not had any significant change and mediastinal adenopathy is also unchanged.  He has been feeling well since he was here last.  No significant cough.  Does not really feel like he has any significant shortness of breath.  He denies any hemoptysis, unintentional weight loss, night sweats, fevers, chills.  Overall feels like he is doing quite well without any concerns or complaints today.  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     No facility-administered medications prior to visit.     Review of Systems:   Constitutional: No weight loss or gain, night  sweats, fevers, chills, fatigue, or lassitude. HEENT: No headaches, difficulty swallowing, tooth/dental problems, or sore throat. No sneezing, itching, ear ache, nasal congestion, or post nasal drip CV:  No chest pain, orthopnea, PND, swelling in lower extremities, anasarca, dizziness, palpitations, syncope Resp: No shortness of breath with exertion or at rest. No excess mucus or change in color of mucus. No productive or non-productive. No hemoptysis. No wheezing.  No chest wall deformity GI:  No heartburn, indigestion, abdominal pain, nausea, vomiting, diarrhea, change in bowel habits, loss of appetite, bloody stools.  GU: No dysuria, change in color of urine, urgency or frequency.   Skin: No rash, lesions, ulcerations MSK:  No joint pain or swelling.   Neuro: No dizziness or lightheadedness.  Psych: No depression or anxiety. Mood stable.     Physical Exam:  BP 138/80 (BP Location: Left Arm, Patient Position: Sitting, Cuff Size: Normal)   Pulse 81   Temp 98 F (36.7 C) (Oral)   Ht 5\' 4"  (1.626 m)   Wt 174 lb 3.2 oz (79 kg)   SpO2 100%   BMI 29.90 kg/m   GEN: Pleasant, interactive, well-appearing; in no acute distress. HEENT:  Normocephalic and atraumatic. PERRLA. Sclera white. Nasal turbinates pink, moist and patent bilaterally. No rhinorrhea present. Oropharynx pink and moist, without exudate or edema. No lesions, ulcerations, or postnasal drip.  NECK:  Supple  w/ fair ROM. No JVD present. Normal carotid impulses w/o bruits. Thyroid symmetrical with no goiter or nodules palpated. No lymphadenopathy.   CV: RRR, no m/r/g, no peripheral edema. Pulses intact, +2 bilaterally. No cyanosis, pallor or clubbing. PULMONARY:  Unlabored, regular breathing. Clear bilaterally A&P w/o wheezes/rales/rhonchi. No accessory muscle use.  GI: BS present and normoactive. Soft, non-tender to palpation. No organomegaly or masses detected. MSK: No erythema, warmth or tenderness. Cap refil <2 sec all extrem.  No deformities or joint swelling noted.  Neuro: A/Ox3. No focal deficits noted.   Skin: Warm, no lesions or rashe Psych: Normal affect and behavior. Judgement and thought content appropriate.     Lab Results:  CBC    Component Value Date/Time   WBC 4.8 09/16/2021 1508   RBC 5.39 09/16/2021 1508   HGB 13.2 09/16/2021 1508   HCT 41.6 09/16/2021 1508   PLT 172.0 09/16/2021 1508   MCV 77.1 (L) 09/16/2021 1508   MCH 25.3 (L) 04/27/2021 1253   MCHC 31.8 09/16/2021 1508   RDW 15.6 (H) 09/16/2021 1508   LYMPHSABS 1.1 09/16/2021 1508   MONOABS 0.6 09/16/2021 1508   EOSABS 0.2 09/16/2021 1508   BASOSABS 0.0 09/16/2021 1508    BMET    Component Value Date/Time   NA 140 04/27/2021 1253   K 4.3 04/27/2021 1253   CL 104 04/27/2021 1253   CO2 29 04/27/2021 1253   GLUCOSE 107 04/27/2021 1253   BUN 12 04/27/2021 1253   CREATININE 0.88 04/27/2021 1253   CALCIUM 9.1 04/27/2021 1253    BNP No results found for: "BNP"   Imaging:  CT Chest Wo Contrast  Result Date: 09/25/2022 CLINICAL DATA:  Mediastinal mass. Mediastinal lymphadenopathy. No evidence for malignancy on bronchoscopy performed 1 year ago. No history of malignancy or prior relevant surgery. Former smoker. EXAM: CT CHEST WITHOUT CONTRAST TECHNIQUE: Multidetector CT imaging of the chest was performed following the standard protocol without IV contrast. RADIATION DOSE REDUCTION: This exam was performed according to the departmental dose-optimization program which includes automated exposure control, adjustment of the mA and/or kV according to patient size and/or use of iterative reconstruction technique. COMPARISON:  Chest CT 04/17/2022 and 08/18/2021.  PET-CT 09/07/2021. FINDINGS: Cardiovascular: Mild atherosclerosis of the aorta, great vessels and coronary arteries again noted. No acute vascular findings on noncontrast imaging. The heart size is normal. There is no pericardial effusion. Mediastinum/Nodes: Small mediastinal lymph  nodes are stable, including a 1.1 cm right paratracheal node on image 45/2 and a subcarinal node measuring 1.0 cm on image 55/2. No progressive thoracic adenopathy identified. Hilar assessment remains limited by the lack of intravenous contrast. The hilar contours appear unchanged. The thyroid gland, trachea and esophagus demonstrate no significant findings. Lungs/Pleura: No pleural effusion or pneumothorax. Unchanged complete right middle lobe collapse with caudal off of the proximal right middle lobe bronchus. The lungs are otherwise clear, without suspicious pulmonary nodularity. Upper abdomen:  The visualized upper abdomen appears unremarkable. Musculoskeletal/Chest wall: There is no chest wall mass or suspicious osseous finding. 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 IMPRESSION: 1. Stable chest CT with chronic right middle lobe collapse and mildly prominent mediastinal lymph nodes. No progressive thoracic adenopathy or suspicious pulmonary nodularity. 2. No acute or new chest findings. 3. Coronary and aortic Atherosclerosis (ICD10-I70.0). Electronically Signed   By: Richardean Sale M.D.   On: 09/25/2022 10:29         Latest Ref Rng & Units 05/24/2021    2:46 PM  PFT Results  FVC-Pre L 2.12   FVC-Post L 2.21   Pre FEV1/FVC % % 77   Post FEV1/FCV % % 77   FEV1-Pre L 1.63   FEV1-Post L 1.70   DLCO uncorrected ml/min/mmHg 16.96   DLCO UNC% % 77   DLCO corrected ml/min/mmHg 17.20   DLCO COR %Predicted % 78   DLVA Predicted % 124   TLC L 6.58   TLC % Predicted % 113   RV % Predicted % 211     No results found for: "NITRICOXIDE"      Assessment & Plan:   Mediastinal lymphadenopathy Unclear etiology but felt to be related to past occupational metal dust exposures.  He has had extensive workup with 2 bronchoscopies which were negative for malignancy.  He did grow out strep salivarius after his October 2022 bronchoscopy and was treated with extended cefpodoxime course; however, he  had no significant change in his CT imaging.  He is asymptomatic currently.  We will plan for repeat CT in 6 months.  He understands any red flag symptoms that he should notify us of.  Patient Instructions  CT today looks stable. Repeat CT chest in 6 months.   Let us know if you develop increase cough, shortness of breath, fevers, chills, night sweats, weight loss. Otherwise, we will continue to monitor your symptoms and scans  Follow up in 6 months with Dr. Valeta Harms or Dr Lamonte Sakai in new patient slot. If new symptoms develop, please contact office for sooner follow up or seek emergency care.    Lung mass See above   I spent 35 minutes of dedicated to the care of this patient on the date of this encounter to include pre-visit review of records, face-to-face time with the patient discussing conditions above, post visit ordering of testing, clinical documentation with the electronic health record, making appropriate referrals as documented, and communicating necessary findings to members of the patients care team.  Clayton Bibles, NP 10/10/2022  Pt aware and understands NP's role.

## 2022-10-10 NOTE — Assessment & Plan Note (Signed)
Unclear etiology but felt to be related to past occupational metal dust exposures.  He has had extensive workup with 2 bronchoscopies which were negative for malignancy.  He did grow out strep salivarius after his October 2022 bronchoscopy and was treated with extended cefpodoxime course; however, he had no significant change in his CT imaging.  He is asymptomatic currently.  We will plan for repeat CT in 6 months.  He understands any red flag symptoms that he should notify us of.  Patient Instructions  CT today looks stable. Repeat CT chest in 6 months.   Let us know if you develop increase cough, shortness of breath, fevers, chills, night sweats, weight loss. Otherwise, we will continue to monitor your symptoms and scans  Follow up in 6 months with Dr. Valeta Harms or Dr Lamonte Sakai in new patient slot. If new symptoms develop, please contact office for sooner follow up or seek emergency care.

## 2022-10-10 NOTE — Patient Instructions (Addendum)
CT today looks stable. Repeat CT chest in 6 months.   Let us know if you develop increase cough, shortness of breath, fevers, chills, night sweats, weight loss. Otherwise, we will continue to monitor your symptoms and scans  Follow up in 6 months with Dr. Valeta Harms or Dr Lamonte Sakai in new patient slot. If new symptoms develop, please contact office for sooner follow up or seek emergency care.

## 2022-10-20 ENCOUNTER — Ambulatory Visit: Payer: Medicare Other | Admitting: Nurse Practitioner

## 2023-04-12 ENCOUNTER — Ambulatory Visit (HOSPITAL_COMMUNITY)
Admission: RE | Admit: 2023-04-12 | Discharge: 2023-04-12 | Disposition: A | Discharge status: Home / Self Care | Payer: No Typology Code available for payment source | Source: Ambulatory Visit | Attending: Nurse Practitioner | Admitting: Nurse Practitioner

## 2023-04-12 DIAGNOSIS — R590 Localized enlarged lymph nodes: Secondary | ICD-10-CM | POA: Insufficient documentation

## 2023-04-12 DIAGNOSIS — J9811 Atelectasis: Secondary | ICD-10-CM | POA: Diagnosis not present

## 2023-04-12 DIAGNOSIS — I700 Atherosclerosis of aorta: Secondary | ICD-10-CM | POA: Diagnosis not present

## 2023-04-12 DIAGNOSIS — R599 Enlarged lymph nodes, unspecified: Secondary | ICD-10-CM | POA: Diagnosis not present

## 2023-04-12 DIAGNOSIS — R918 Other nonspecific abnormal finding of lung field: Secondary | ICD-10-CM | POA: Insufficient documentation

## 2023-05-01 NOTE — Progress Notes (Signed)
Stable CT chest with persistent RML collapse. Needs follow up with new pulmonologist. Previous pt of Dr. Thora Lance.

## 2024-01-17 NOTE — Progress Notes (Signed)
 Welcome to Medicare   Patient Care Team: Perri Ronal PARAS, MD as PCP - General (Internal Medicine)  Visit Date: 01/18/24   Chief Complaint  Patient presents with   Welcome To Medicare    States urinating too much.    Subjective:  Patient: Sergio Lawson, Male DOB: 1956/01/12, 68 y.o. MRN: [id number] Vitals:   01/18/24 1423 01/18/24 1451  BP: (!) 190/92 (!) 160/100   Regie Gerads is a 68 y.o. Male who presents today for his Welcome to Medicare. Patient has Back pain; History of hepatitis B; History of vertigo; Pneumatocele of lung; Lung mass; and Mediastinal lymphadenopathy on their problem list. Mainly seen in this office for acute visits for URIs, this is his 1st annual well visit in this office, hasn't been seen since 04/26/2021.  History of Chronic Back Pain treated with Tylenol [phone] mg every 6 hours as needed for mild pain, and he was previously on disability for this, but is now retired. In 2006 he had right leg radiculopathy, was found to have a large ruptured disc at L4-5, and subsequently underwent surgery, but afterwards continued to have persistent back pain and radiculopathy.  History of Vertigo in 2020.  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 Both mass and lymphadenopathy have remained stable throughout the years.  History of Chronic Hepatitis B in 1992 seen by Dr. Dyane and had a liver biopsy showing mild chronic active hepatitis. His liver enzymes did improve, and Dr. Dyane thought there may be a component of alcoholic liver disease, but patient denied drinking. When Interferon treatment was offered, patient declined.   Labs to be collected at a later date.  Colonoscopy due.  PSA  1.68  04/27/2021  Vaccine Counseling: Due for Covid-19, Shingles 1/2, and Tdap; UTD on Flu and  PNA. Past Medical History:  Diagnosis Date   Chronic hepatitis B [facility])    Medical/Surgical History Narrative:   Allergic/Intolerant to: Codeine - nausea; Levquin/Levofloxacin - nausea  Past Surgical History:  Procedure Laterality Date   BRONCHIAL BIOPSY  05/17/2021   Procedure: BRONCHIAL BIOPSIES;  Surgeon: Mannam, Praveen, MD;  Location: [facility];  Service: Cardiopulmonary;;   BRONCHIAL BRUSHINGS  05/17/2021   Procedure: BRONCHIAL BRUSHINGS;  Surgeon: Mannam, Praveen, MD;  Location: [facility];  Service: Cardiopulmonary;;   BRONCHIAL NEEDLE ASPIRATION BIOPSY  09/30/2021   Procedure: BRONCHIAL NEEDLE ASPIRATION BIOPSIES;  Surgeon: Gladis Leonor HERO, MD;  Location: [facility];  Service: Pulmonary;;   BRONCHIAL WASHINGS  05/17/2021   Procedure: BRONCHIAL WASHINGS;  Surgeon: Mannam, Praveen, MD;  Location: [facility];  Service: Cardiopulmonary;;   ENDOBRONCHIAL ULTRASOUND N/A 09/30/2021   Procedure: ENDOBRONCHIAL ULTRASOUND;  Surgeon: Gladis Leonor HERO, MD;  Location: [facility];  Service: Pulmonary;  Laterality: N/A;   FINE NEEDLE ASPIRATION  05/17/2021   Procedure: FINE NEEDLE ASPIRATION (FNA) LINEAR;  Surgeon: Theophilus Roosevelt, MD;  Location: [facility];  Service: Cardiopulmonary;;   LUMBAR DISC SURGERY  2011   x 2012   SPINE SURGERY  07/25/2009   herniated disc   VIDEO BRONCHOSCOPY  09/30/2021   Procedure: VIDEO BRONCHOSCOPY WITHOUT FLUORO;  Surgeon: Gladis Leonor HERO, MD;  Location: [facility];  Service: Pulmonary;;   VIDEO BRONCHOSCOPY WITH ENDOBRONCHIAL ULTRASOUND N/A 05/17/2021   Procedure: VIDEO BRONCHOSCOPY WITH ENDOBRONCHIAL ULTRASOUND;  Surgeon: Mannam, Praveen, MD;  Location: [facility];  Service: Cardiopulmonary;  Laterality: N/A;   Family History  Problem Relation Age of Onset   Cancer Mother    Social History   Social History Narrative   Previously worked as a Location manager in [place], after 2006 could not return to work due to persistent back  pain and radiculopathy and was on disability until retirement. Married - wife previously worked in a [employer] but is retired. Both he and his wife moved here from [place] in 1980 sponsored by [employer] - they still have family in [place] and take trips there. Enjoys gardening with his wife.  Review of Systems  Constitutional:  Negative for chills, fever, malaise/fatigue and weight loss.  HENT:  Negative for hearing loss, sinus pain and sore throat.   Respiratory:  Negative for cough, hemoptysis and shortness of breath.   Cardiovascular:  Negative for chest pain, palpitations, leg swelling and PND.  Gastrointestinal:  Negative for abdominal pain, constipation, diarrhea, heartburn, nausea and vomiting.  Genitourinary:  Positive for frequency. Negative for dysuria and urgency.  Musculoskeletal:  Negative for back pain, myalgias and neck pain.  Skin:  Negative for itching and rash.  Neurological:  Negative for dizziness, tingling, seizures and headaches.  Endo/Heme/Allergies:  Negative for polydipsia.  Psychiatric/Behavioral:  Negative for depression. The patient is not nervous/anxious.     Objective:  Vitals: BP (!) 160/100 (BP Location: Left Arm, Patient Position: Sitting, Cuff Size: Large)   Pulse 67   Temp 98 F (36.7 C) (Temporal)   Ht 5' 4 (1.626 m)   Wt 168 lb 1.9 oz (76.3 kg)   SpO2 98%   BMI 28.86 kg/m  Physical Exam Vitals and nursing note reviewed. Exam conducted with a chaperone present Susette Blanks, Medical Scribe).  Constitutional:      General: He is awake. He is not in acute distress.    Appearance: Normal appearance. He is not ill-appearing or toxic-appearing.  HENT:     Head: Normocephalic and atraumatic.     Right Ear: Tympanic membrane, ear canal and external ear normal.     Left Ear: Tympanic membrane, ear canal and external ear normal.     Mouth/Throat:     Pharynx: Oropharynx is clear.   Eyes:     Extraocular Movements: Extraocular movements  intact.     Pupils: Pupils are equal, round, and reactive to light.   Neck:     Thyroid: No thyroid mass, thyromegaly or thyroid tenderness.     Vascular: No carotid bruit.   Cardiovascular:     Rate and Rhythm: Normal rate and regular rhythm. No extrasystoles are present.    Pulses:          Dorsalis pedis pulses are 1+ on the right side and 1+ on the left side.     Heart sounds: Normal heart sounds. No murmur heard.    No friction rub. No gallop.  Pulmonary:     Effort: Pulmonary effort is normal.     Breath sounds: Normal breath sounds. No decreased breath sounds, wheezing, rhonchi or rales.  Chest:     Chest wall: No mass.  Abdominal:     Palpations: Abdomen is soft. There is no hepatomegaly, splenomegaly or mass.     Tenderness: There is no abdominal tenderness.     Hernia: No hernia is present.  Genitourinary:    Prostate: Normal. Not enlarged and no nodules present.     Rectum: Guaiac result negative.   Musculoskeletal:     Cervical back: Normal range of  motion.     Right hip: No crepitus.     Left hip: No crepitus.     Right knee: No crepitus.     Left knee: No crepitus.     Right lower leg: No edema.     Left lower leg: No edema.  Lymphadenopathy:     Cervical: No cervical adenopathy.     Upper Body:     Right upper body: No supraclavicular adenopathy.     Left upper body: No supraclavicular adenopathy.   Skin:    General: Skin is warm and dry.   Neurological:     General: No focal deficit present.     Mental Status: He is alert and oriented to person, place, and time. Mental status is at baseline.     Cranial Nerves: Cranial nerves 2-12 are intact.     Sensory: Sensation is intact.     Motor: Motor function is intact.     Coordination: Coordination is intact.     Gait: Gait is intact.     Deep Tendon Reflexes: Reflexes are normal and symmetric.   Psychiatric:        Attention and Perception: Attention normal.        Mood and Affect: Mood normal.         Speech: Speech normal.        Behavior: Behavior normal. Behavior is cooperative.        Thought Content: Thought content normal.        Cognition and Memory: Cognition and memory normal.        Judgment: Judgment normal.   Most Recent Functional Status Assessment:    01/18/2024    2:09 PM  In your present state of health, do you have any difficulty performing the following activities:  Hearing? 1  Vision? 0  Difficulty concentrating or making decisions? 0  Walking or climbing stairs? 0  Dressing or bathing? 0  Doing errands, shopping? 0  Preparing Food and eating ? N  Using the Toilet? N  In the past six months, have you accidently leaked urine? N  Do you have problems with loss of bowel control? N  Managing your Medications? N  Managing your Finances? N  Housekeeping or managing your Housekeeping? N   Most Recent Fall Risk Assessment:    01/18/2024    2:11 PM  Fall Risk   Falls in the past year? 0  Number falls in past yr: 0  Injury with Fall? 0  Risk for fall due to : No Fall Risks  Follow up Falls evaluation completed   Most Recent Depression Screenings:    01/18/2024    2:12 PM 04/26/2021   12:00 PM  PHQ 2/9 Scores  PHQ - 2 Score 0 0   Most Recent Cognitive Screening:    01/18/2024    2:12 PM  6CIT Screen  What Year? 0 points  What time? 0 points  Count back from 20 0 points  Months in reverse 0 points  Repeat phrase 0 points   Results:  Studies Obtained And Personally Reviewed By Me:  CT CHEST WITH CONTRAST  04/30/2021  COMPARISON:  April 26, 2021.   FINDINGS: Cardiovascular: No evidence of thoracic aortic aneurysm. Normal cardiac size. No pericardial effusion. Coronary artery calcifications are noted.   Mediastinum/Nodes: Thyroid gland is unremarkable. The esophagus is unremarkable. 10 mm subcarinal lymph node is noted. 12 mm precarinal lymph node is noted. 10 mm right hilar lymph node is noted. There appears  to be occlusion of right middle  lobe bronchus secondary to probable hilar mass or malignancy.   Lungs/Pleura: No pneumothorax is noted. Left lung is clear. There appears to be some degree of right middle lobe atelectasis secondary to previously described occlusion or compression of the right mainstem bronchus secondary to probable hilar mass or malignancy.   Upper Abdomen: No acute abnormality.   Musculoskeletal: No chest wall abnormality. No acute or significant osseous findings.   IMPRESSION: There appears to be occlusion or compression of the right middle lobe bronchus secondary to probable right hilar mass or possibly endobronchial lesion concerning for malignancy. This results in atelectasis of the right middle lobe. There is also noted mildly enlarged right hilar, subcarinal and precarinal adenopathy concerning for metastatic disease.     Bronchoscopy 05/17/2021 did not identify any malignant cells in RML lung, station 7 lymph node. Noted benign reactive/reparative changes in hilar mass and benign reactive/reparative changes in RML lung brushing.  CHEST - 2 VIEW  05/19/2021  COMPARISON:  04/27/2021   FINDINGS: Normal cardiac silhouette. Focus of increased density measuring 2 cm in the mid RIGHT lung about the hilum suggest focus of pulmonary atelectasis or hemorrhage/contusion. More superiorly there is a 4.6 cm round cystic appearing lesion.   No pneumothorax.  Small RIGHT effusion is increased from prior.   IMPRESSION: 1. New small RIGHT effusion and focus of pulmonary hemorrhage in the RIGHT midlung.  2. Superior to the presumed contusion there is a rounded cystic lesion suggesting traumatic pneumatocele.   CT CHEST WITHOUT CONTRAST  08/19/2021  COMPARISON:  Chest x-ray 05/27/2021.  CT chest 04/28/2021.   FINDINGS: Cardiovascular: No significant vascular findings. Normal heart size. No pericardial effusion. There are atherosclerotic calcifications of the coronary arteries and aorta.    Mediastinum/Nodes: There is an enlarged precarinal lymph node measuring 10 mm short axis which appears similar to the prior study. There is an enlarged subcarinal lymph node measuring 11 mm short axis which appears similar to the prior study. No new enlarged lymph nodes are identified. Thyroid gland and visualized esophagus are within normal limits.   Lungs/Pleura: There are minimal new tree-in-bud opacities in the left lower lobe, likely infectious/inflammatory. Right middle lobe/perihilar mass is again suspected with cutoff of the right middle lobe bronchus and complete collapse of the right middle lobe. Findings are unchanged from the prior examination. The lungs are otherwise clear. There is a trace right pleural effusion which is new from prior. No pneumothorax.   Upper Abdomen: No acute abnormality.   Musculoskeletal: No chest wall mass or suspicious bone lesions identified.   IMPRESSION: 1. Again seen is cutoff of the right middle lobe bronchus with complete collapse of the right middle lobe. Findings are suspicious for endobronchial lesion or mass in the region of the right hilum. Findings are unchanged from prior.  2. Minimal new tree-in-bud opacities in the left lower lobe, likely infectious/inflammatory.  3. New small right pleural effusion.  4. Stable mediastinal lymphadenopathy.   NUCLEAR MEDICINE PET SKULL BASE TO THIGH  09/08/2021  COMPARISON:  Chest CT 08/18/2021   FINDINGS: Mediastinal blood pool activity: SUV max 2.5   Liver activity: SUV max NA   NECK: Symmetric activity along the palatine tonsils, maximum SUV 7.0 on the right and 7.1 on the left, no suspicious mass lesion on the CT data, appearance favors physiologic activity.   Incidental CT findings: none   CHEST: Centrally in the right middle lobe and corresponding to the vicinity of the airway  obstruction, there is hypermetabolic lesion with maximum SUV of 8.3 extending along the  bronchovascular structures, with extension in the right middle lobe and along the anterior margin of the bronchus intermedius. On axial images this abnormal hypermetabolic activity measures about 1.7 by 1.1 cm. There is also hypermetabolic activity in the collapsed right middle lobe just distal to this lesion, maximum SUV 7.3, potentially representing a separate nodule.   Hypermetabolic right paratracheal and subcarinal adenopathy observed, the 1.0 cm right paratracheal node adjacent to the carina on image 68 series 4 has maximum SUV of 13.5 and the small subcarinal lymph nodes have maximum SUV of 6.9.   Small but hypermetabolic left infrahilar lymph nodes, maximum SUV 4.8   A small focus of right anterior pleural thickening at the level of the carina has maximum SUV of 4.3 although this thickening is only about 3 mm total thickness. A 5 mm AP window lymph node on image 68 series 4 has maximum SUV of 3.4.   Incidental CT findings: Coronary, aortic arch, and branch vessel atherosclerotic vascular disease. Mild cardiomegaly. Persistent complete atelectasis of the right middle lobe.   ABDOMEN/PELVIS: No significant abnormal hypermetabolic activity in this region.   Incidental CT findings: Atherosclerosis is present, including aortoiliac atherosclerotic disease.   SKELETON: No significant abnormal hypermetabolic activity in this region.   Incidental CT findings: none   IMPRESSION: 1. Central right middle lobe hypermetabolic nodule with maximum SUV 8.3, along with adjacent hypermetabolic nodularity in the collapsed right middle lobe, hypermetabolic ipsilateral and contralateral mediastinal adenopathy, small hypermetabolic left infrahilar lymph nodes, and a hypermetabolic focus along the right anterior pleura. Malignancy is favored over entities such as sarcoidosis, tissue diagnosis recommended.  2. No findings of metastatic disease to the abdomen/pelvis, neck, or skeleton.  3.  Other imaging findings of potential clinical significance: Aortic Atherosclerosis (ICD10-I70.0). Peripheral and coronary atherosclerosis. Mild cardiomegaly.   Bronchoscopy 09/30/2021 did not identify any malignant cells in lymph node 4R or 7.  CT CHEST WITHOUT CONTRAST  09/25/2022  COMPARISON:  PET-CT 09/07/2021.  CT chest 08/18/2021.   FINDINGS: Cardiovascular: No significant vascular findings. Normal heart size. No pericardial effusion. There are atherosclerotic calcifications of the aorta and coronary arteries.   Mediastinum/Nodes: Visualized thyroid gland is within normal limits. There is an enlarged precarinal lymph node measuring 11 mm which is similar to the prior study. Prominent AP window lymph nodes are unchanged. Difficult to assess for hilar adenopathy secondary to lack of contrast. Hila appear grossly unchanged. Esophagus is within normal limits.   Lungs/Pleura: Again seen is cutoff of the right middle lobe bronchus with complete collapse of the right middle lobe. Findings are unchanged. No new focal lung infiltrate, pleural effusion or pneumothorax. No new discrete pulmonary nodule.   Upper Abdomen: No acute abnormality.   Musculoskeletal: No chest wall mass or suspicious bone lesions identified.   IMPRESSION: 1. Stable cutoff of right middle lobe bronchus with complete collapse of the right middle lobe.  2. Stable mediastinal lymphadenopathy.   3. Aortic Atherosclerosis   CT CHEST WITHOUT CONTRAST  09/25/2022  CLINICAL DATA:  Mediastinal mass. Mediastinal lymphadenopathy. No evidence for malignancy on bronchoscopy performed 1 year ago. No history of malignancy or prior relevant surgery. Former smoker.  COMPARISON:  Chest CT 04/17/2022 and 08/18/2021.  PET-CT 09/07/2021.   FINDINGS: Cardiovascular: Mild atherosclerosis of the aorta, great vessels and coronary arteries again noted. No acute vascular findings on noncontrast imaging. The heart size is normal.  There is no pericardial effusion.  Mediastinum/Nodes: Small mediastinal lymph nodes are stable, including a 1.1 cm right paratracheal node on image 45/2 and a subcarinal node measuring 1.0 cm on image 55/2. No progressive thoracic adenopathy identified. Hilar assessment remains limited by the lack of intravenous contrast. The hilar contours appear unchanged. The thyroid gland, trachea and esophagus demonstrate no significant findings.   Lungs/Pleura: No pleural effusion or pneumothorax. Unchanged complete right middle lobe collapse with caudal off of the proximal right middle lobe bronchus. The lungs are otherwise clear, without suspicious pulmonary nodularity.   Upper abdomen:  The visualized upper abdomen appears unremarkable.   Musculoskeletal/Chest wall: There is no chest wall mass or suspicious osseous finding. 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   IMPRESSION: 1. Stable chest CT with chronic right middle lobe collapse and mildly prominent mediastinal lymph nodes. No progressive thoracic adenopathy or suspicious pulmonary nodularity.  2. No acute or new chest findings.  3. Coronary and aortic Atherosclerosis    CT CHEST WITHOUT CONTRAST  04/17/2023  CLINICAL DATA:  Follow up adenopathy and atelectasis.   COMPARISON:  09/23/2022.   FINDINGS: Cardiovascular: Atheromatous calcifications of the aorta and coronary arteries. No cardiomegaly or pericardial effusion.   Mediastinum/Nodes: Right paratracheal 1.1 cm short axis node is stable. Additional smaller mediastinal nodes unchanged. No new suspicious adenopathy. Thyroid, esophagus and tracheobronchial tree are unremarkable.   Lungs/Pleura: There is persistent right middle lobe atelectasis. This stability over time suggests possible central obstructing lesion, and consideration should be given to bronchoscopic correlation. Lungs are otherwise clear. No pneumothorax or pleural effusion.   Upper Abdomen: No  acute abnormality.   Musculoskeletal: No chest wall mass or suspicious bone lesions identified.   IMPRESSION: 1. Persistent right middle lobe atelectasis. Findings suggest possible central obstructing lesion which could be evaluated further bronchoscopically.  2. Stable minimally prominent mediastinal nodes.   EKG normal.   Labs:     Component Value Date/Time   NA 140 04/27/2021 1253   K 4.3 04/27/2021 1253   CL 104 04/27/2021 1253   CO2 29 04/27/2021 1253   GLUCOSE 107 04/27/2021 1253   BUN 12 04/27/2021 1253   CREATININE 0.88 04/27/2021 1253   CALCIUM 9.1 04/27/2021 1253   PROT 7.8 04/27/2021 1253   AST 41 (H) 04/27/2021 1253   ALT 17 04/27/2021 1253   BILITOT 0.6 04/27/2021 1253    Lab Results  Component Value Date   WBC 4.8 09/16/2021   HGB 13.2 09/16/2021   HCT 41.6 09/16/2021   MCV 77.1 (L) 09/16/2021   PLT 172.0 09/16/2021   Lab Results  Component Value Date   TSH 1.55 04/27/2021    Lab Results  Component Value Date   PSA 1.68 04/27/2021     Assessment & Plan:   Orders Placed This Encounter  Procedures   Basic Metabolic Panel with GFR   Lipid panel   Hepatic function panel   Ambulatory referral to Gastroenterology (for Colonoscopy)    Referral Priority:   Routine    Referral Type:   Consultation    Referral Reason:   Specialty Services Required    Number of Visits Requested:   1   POCT urinalysis dipstick   Hemoccult - 1 Card (office)   EKG 12-Lead   Elevated Blood Pressure today of 190/92, 28 minutes later 160/100.   Chronic Back Pain treated with Tylenol [phone] mg every 6 hours as needed for mild pain, and he was previously on disability for this, but is now retired. In 2006 he had  right leg radiculopathy, was found to have a large ruptured disc at L4-5, and subsequently underwent surgery, but afterwards continued to have persistent back pain and radiculopathy.  History of Vertigo in 2020.  History of Smoking 0.5 PPD x20 years, quit in  2011. 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 Right Lung Hilar Mass noted 04/2021. Mediastinal Lymphadenopathy noted 04/2021, S/p Bronchoscopy followed by Pulmonology with annual CXRs. Both mass and lymphadenopathy have remained stable throughout the years.  History of Chronic Hepatitis B in 1992 seen by Dr. Dyane and had a liver biopsy showing mild chronic active hepatitis. His liver enzymes did improve, and Dr. Dyane thought there may be a component of alcoholic liver disease, but patient denied drinking. When Interferon treatment was offered, patient declined. Needs LFTs checked.  Colonoscopy due - referral placed.  PSA  1.68  04/27/2021.   Vaccine Counseling: Due for Covid-19, Shingles 1/2, and Tdap; UTD on Flu and PNA.  Return in about 4 days (around 01/22/2024) for annual labs (C-MET, Lipid Panel, PSA, CBC) & BP recheck (190/92, 160/100).   Addendum: labs drawn on June 30th. Results pending.   Annual wellness visit done today including the all of the following: Reviewed patient's Family Medical History Reviewed and updated list of patient's medical providers Assessment of cognitive impairment was done Assessed patient's functional ability Established a written schedule for health screening services Health Risk Assessent Completed and Reviewed  Discussed health benefits of physical activity, and encouraged him to engage in regular exercise appropriate for his age and condition.    I,Emily Lagle,acting as a Neurosurgeon for Ronal JINNY Hailstone, MD.,have documented all relevant documentation on the behalf of Ronal JINNY Hailstone, MD,as directed by  Ronal JINNY Hailstone, MD while in the presence of Ronal JINNY Hailstone, MD.   I, Ronal JINNY Hailstone, MD, have reviewed all documentation for this visit. The documentation on 01/22/24 for the exam, diagnosis, procedures, and orders are all accurate and complete.

## 2024-01-18 ENCOUNTER — Ambulatory Visit: Admitting: Internal Medicine

## 2024-01-18 ENCOUNTER — Encounter: Payer: Self-pay | Admitting: Internal Medicine

## 2024-01-18 VITALS — BP 160/100 | HR 67 | Temp 98.0°F | Ht 64.0 in | Wt 168.1 lb

## 2024-01-18 DIAGNOSIS — Z23 Encounter for immunization: Secondary | ICD-10-CM

## 2024-01-18 DIAGNOSIS — M545 Low back pain, unspecified: Secondary | ICD-10-CM | POA: Diagnosis not present

## 2024-01-18 DIAGNOSIS — B181 Chronic viral hepatitis B without delta-agent: Secondary | ICD-10-CM

## 2024-01-18 DIAGNOSIS — Z0000 Encounter for general adult medical examination without abnormal findings: Secondary | ICD-10-CM | POA: Diagnosis not present

## 2024-01-18 DIAGNOSIS — Z9889 Other specified postprocedural states: Secondary | ICD-10-CM | POA: Diagnosis not present

## 2024-01-18 DIAGNOSIS — G8929 Other chronic pain: Secondary | ICD-10-CM | POA: Diagnosis not present

## 2024-01-18 DIAGNOSIS — Z87891 Personal history of nicotine dependence: Secondary | ICD-10-CM

## 2024-01-18 DIAGNOSIS — Z1211 Encounter for screening for malignant neoplasm of colon: Secondary | ICD-10-CM

## 2024-01-18 DIAGNOSIS — R590 Localized enlarged lymph nodes: Secondary | ICD-10-CM | POA: Diagnosis not present

## 2024-01-18 LAB — HEMOCCULT GUIAC POC 1CARD (OFFICE): Fecal Occult Blood, POC: NEGATIVE

## 2024-01-18 LAB — POCT URINALYSIS DIPSTICK
Bilirubin, UA: NEGATIVE
Glucose, UA: NEGATIVE
Ketones, UA: NEGATIVE
Leukocytes, UA: NEGATIVE
Nitrite, UA: NEGATIVE
Protein, UA: NEGATIVE
Spec Grav, UA: >=1.03 — AB (ref 1.010–1.025)
Urobilinogen, UA: 0.2 U/dL
pH, UA: 6 (ref 5.0–8.0)

## 2024-01-22 ENCOUNTER — Encounter: Payer: Self-pay | Admitting: Internal Medicine

## 2024-01-22 ENCOUNTER — Other Ambulatory Visit

## 2024-01-22 DIAGNOSIS — B181 Chronic viral hepatitis B without delta-agent: Secondary | ICD-10-CM | POA: Diagnosis not present

## 2024-01-22 DIAGNOSIS — R590 Localized enlarged lymph nodes: Secondary | ICD-10-CM | POA: Diagnosis not present

## 2024-01-22 DIAGNOSIS — M545 Low back pain, unspecified: Secondary | ICD-10-CM | POA: Diagnosis not present

## 2024-01-22 DIAGNOSIS — Z1329 Encounter for screening for other suspected endocrine disorder: Secondary | ICD-10-CM | POA: Diagnosis not present

## 2024-01-22 DIAGNOSIS — Z0000 Encounter for general adult medical examination without abnormal findings: Secondary | ICD-10-CM | POA: Diagnosis not present

## 2024-01-22 DIAGNOSIS — G8929 Other chronic pain: Secondary | ICD-10-CM | POA: Diagnosis not present

## 2024-01-22 DIAGNOSIS — E785 Hyperlipidemia, unspecified: Secondary | ICD-10-CM | POA: Diagnosis not present

## 2024-01-22 DIAGNOSIS — Z13220 Encounter for screening for lipoid disorders: Secondary | ICD-10-CM | POA: Diagnosis not present

## 2024-01-22 DIAGNOSIS — E7800 Pure hypercholesterolemia, unspecified: Secondary | ICD-10-CM | POA: Diagnosis not present

## 2024-01-22 NOTE — Patient Instructions (Addendum)
 Patient to have labs drawn on June 30th fasting. Need to see if liver functions are stable. Needs to consider shingles vaccine at pharmacy. Needs second vaccine for shingles. This can be done at pharmacy. We will contact you with lab results and further recommendations.

## 2024-01-23 ENCOUNTER — Ambulatory Visit: Payer: Self-pay | Admitting: Internal Medicine

## 2024-01-23 ENCOUNTER — Encounter: Payer: Self-pay | Admitting: Internal Medicine

## 2024-01-23 ENCOUNTER — Telehealth: Payer: Self-pay | Admitting: Internal Medicine

## 2024-01-23 LAB — CBC WITH DIFFERENTIAL/PLATELET
Absolute Lymphocytes: 1640 {cells}/uL (ref 850–3900)
Absolute Monocytes: 413 {cells}/uL (ref 200–950)
Basophils Absolute: 30 {cells}/uL (ref 0–200)
Basophils Relative: 0.5 %
Eosinophils Absolute: 271 {cells}/uL (ref 15–500)
Eosinophils Relative: 4.6 %
HCT: 46.8 % (ref 38.5–50.0)
Hemoglobin: 14.2 g/dL (ref 13.2–17.1)
MCH: 24.7 pg — ABNORMAL LOW (ref 27.0–33.0)
MCHC: 30.3 g/dL — ABNORMAL LOW (ref 32.0–36.0)
MCV: 81.4 fL (ref 80.0–100.0)
MPV: 11 fL (ref 7.5–12.5)
Monocytes Relative: 7 %
Neutro Abs: 3546 {cells}/uL (ref 1500–7800)
Neutrophils Relative %: 60.1 %
Platelets: 180 10*3/uL (ref 140–400)
RBC: 5.75 10*6/uL (ref 4.20–5.80)
RDW: 14.8 % (ref 11.0–15.0)
Total Lymphocyte: 27.8 %
WBC: 5.9 10*3/uL (ref 3.8–10.8)

## 2024-01-23 LAB — COMPLETE METABOLIC PANEL WITHOUT GFR
AG Ratio: 1 (calc) (ref 1.0–2.5)
ALT: 17 U/L (ref 9–46)
AST: 39 U/L — ABNORMAL HIGH (ref 10–35)
Albumin: 3.8 g/dL (ref 3.6–5.1)
Alkaline phosphatase (APISO): 88 U/L (ref 35–144)
BUN: 14 mg/dL (ref 7–25)
CO2: 26 mmol/L (ref 20–32)
Calcium: 8.9 mg/dL (ref 8.6–10.3)
Chloride: 102 mmol/L (ref 98–110)
Creat: 0.86 mg/dL (ref 0.70–1.35)
Globulin: 3.8 g/dL — ABNORMAL HIGH (ref 1.9–3.7)
Glucose, Bld: 92 mg/dL (ref 65–99)
Potassium: 4.4 mmol/L (ref 3.5–5.3)
Sodium: 138 mmol/L (ref 135–146)
Total Bilirubin: 0.5 mg/dL (ref 0.2–1.2)
Total Protein: 7.6 g/dL (ref 6.1–8.1)

## 2024-01-23 LAB — PSA: PSA: 1.72 ng/mL (ref <=4.00)

## 2024-01-23 LAB — LIPID PANEL
Cholesterol: 269 mg/dL — ABNORMAL HIGH (ref <200)
HDL: 60 mg/dL (ref >=40)
LDL Cholesterol (Calc): 187 mg/dL — ABNORMAL HIGH
Non-HDL Cholesterol (Calc): 209 mg/dL — ABNORMAL HIGH (ref <130)
Total CHOL/HDL Ratio: 4.5 (calc) (ref <5.0)
Triglycerides: 97 mg/dL (ref <150)

## 2024-01-23 MED ORDER — ROSUVASTATIN CALCIUM 5 MG PO TABS: 5.0000 mg | ORAL_TABLET | Freq: Every day | ORAL | 1 refills | Status: DC

## 2024-01-23 NOTE — Telephone Encounter (Signed)
 Called patient to review labs.   Does drink beer daily and this is likely why  AST(liver function)is slightly elevated at 39. Other liver functions are normal.  Glucose is normal so no diabetes. Kidney functions are normal.   Blood test for prostate cancer is normal. Electrolytes are normal.  Cholesterol is elevated at 269 and he needs to take cholesterol lowering medication with supper daily. In 3 months, he will return for fasting lipid liver, Hepatitis C antibody as well as office visit. Starting him on Crestor 5 mg daily for high cholesterol.

## 2024-02-12 ENCOUNTER — Ambulatory Visit (INDEPENDENT_AMBULATORY_CARE_PROVIDER_SITE_OTHER): Admitting: Internal Medicine

## 2024-02-12 ENCOUNTER — Encounter: Payer: Self-pay | Admitting: Internal Medicine

## 2024-02-12 VITALS — BP 170/100 | HR 78 | Temp 98.6°F | Ht 64.0 in

## 2024-02-12 DIAGNOSIS — R059 Cough, unspecified: Secondary | ICD-10-CM

## 2024-02-12 DIAGNOSIS — K209 Esophagitis, unspecified without bleeding: Secondary | ICD-10-CM

## 2024-02-12 LAB — POC COVID19/FLU A&B COMBO
Covid Antigen, POC: NEGATIVE
Influenza A Antigen, POC: NEGATIVE
Influenza B Antigen, POC: NEGATIVE

## 2024-02-12 IMAGING — PT NM PET TUM IMG INITIAL (PI) SKULL BASE T - THIGH
1 series · 19 of 19 positions shown · non-contrast
Comparison: Chest CT 08/18/2021

CLINICAL DATA: Initial treatment strategy for right middle lobe
nodule/collapse with mediastinal adenopathy.

EXAM:
NUCLEAR MEDICINE PET SKULL BASE TO THIGH
TECHNIQUE: 8.6 mCi F-18 FDG was injected intravenously. Full-ring PET imaging
was performed from the skull base to thigh after the radiotracer. CT
data was obtained and used for attenuation correction and anatomic
localization.
Fasting blood glucose: 105 mg/dl

[Series 1092: results mm oncology reading · 5.0mm · 0.73mm/px · 19 of 19 slices shown]
[im 1/19]
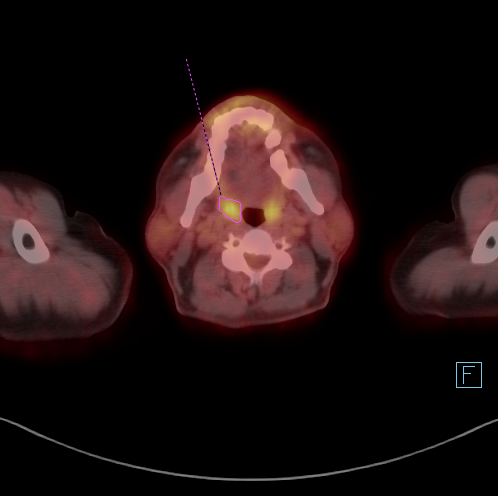
[im 2/19]
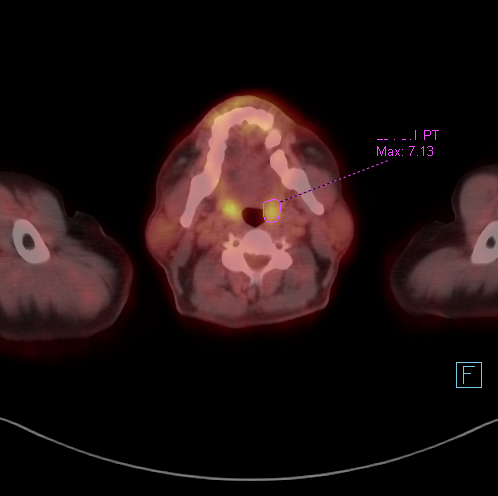
[im 3/19]
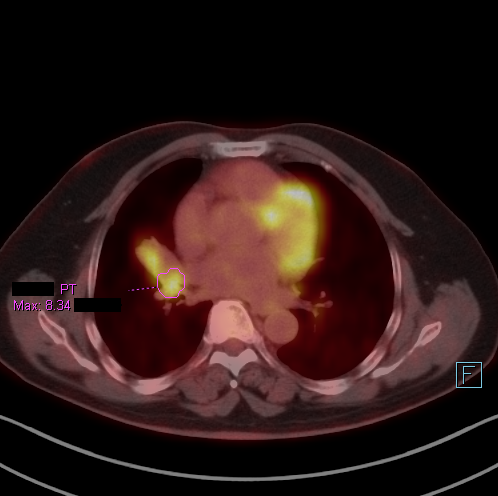
[im 4/19]
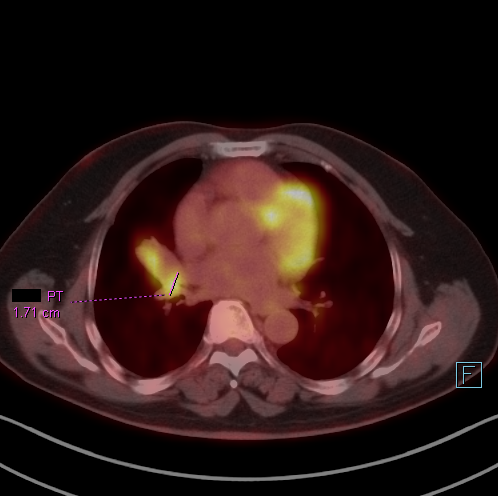
[im 5/19]
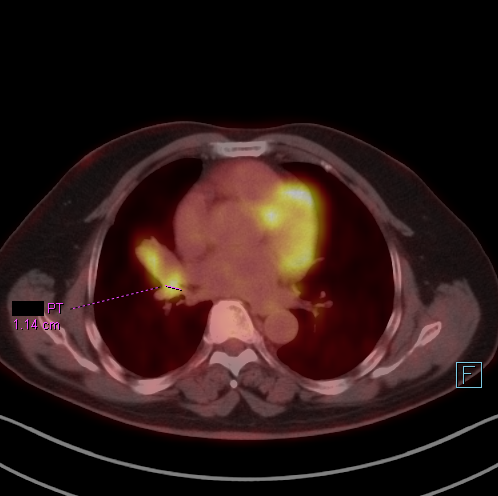
[im 6/19]
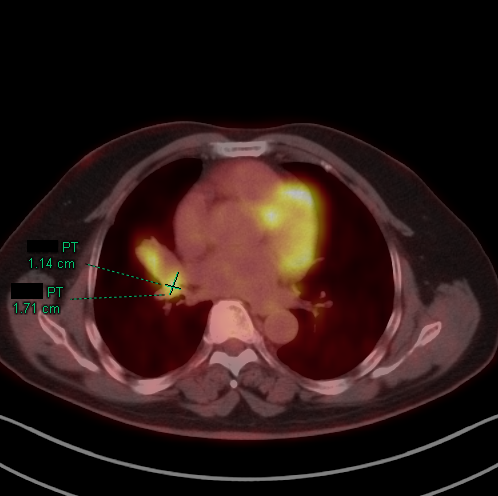
[im 7/19]
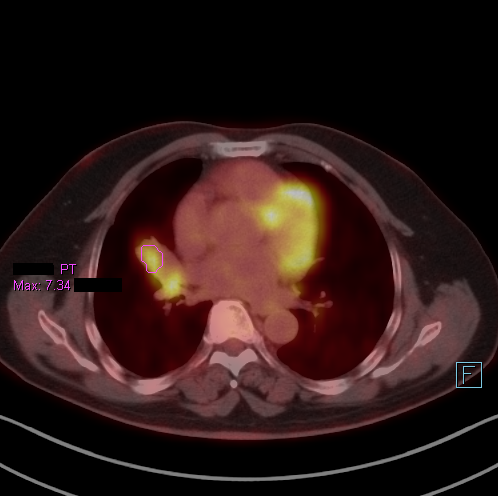
[im 8/19]
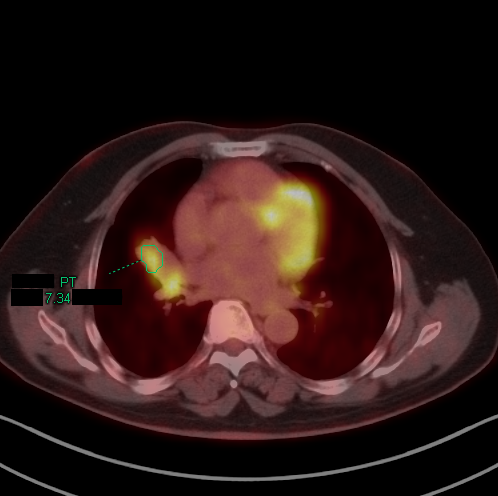
[im 9/19]
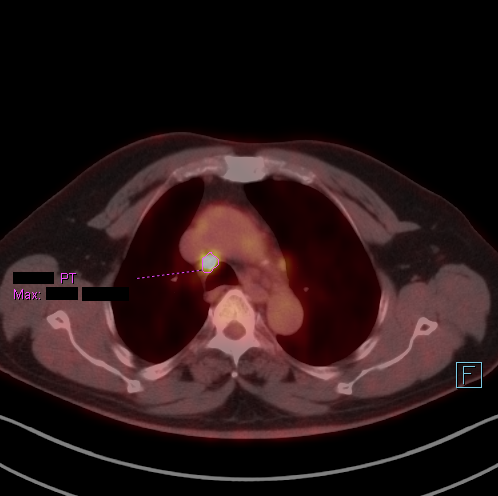
[im 10/19]
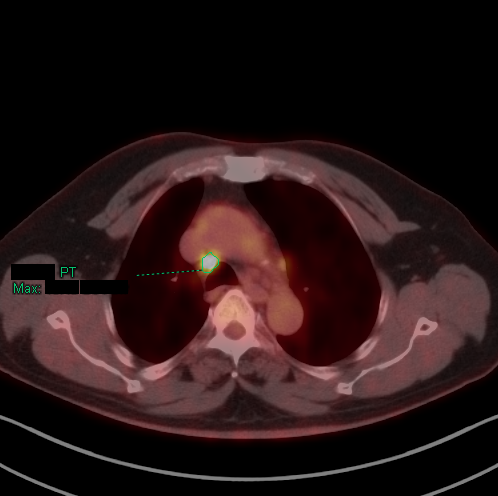
[im 11/19]
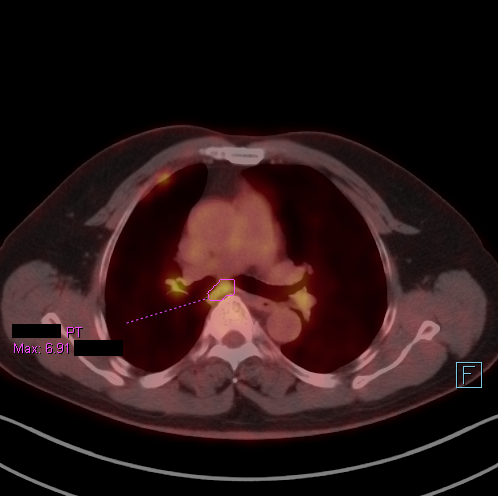
[im 12/19]
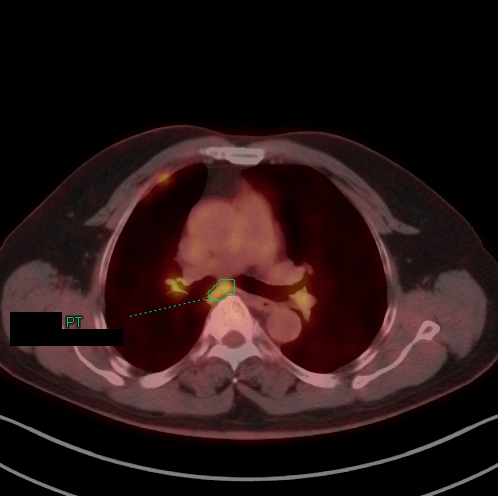
[im 13/19]
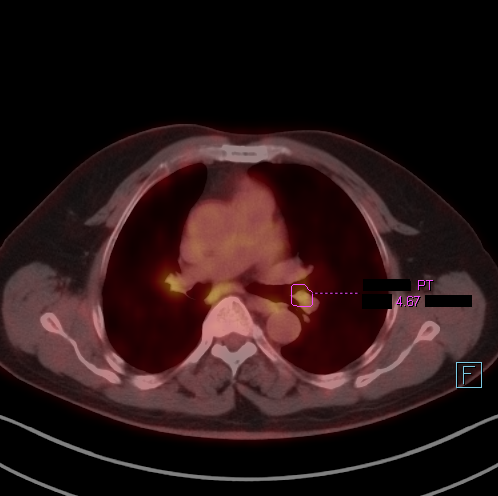
[im 14/19]
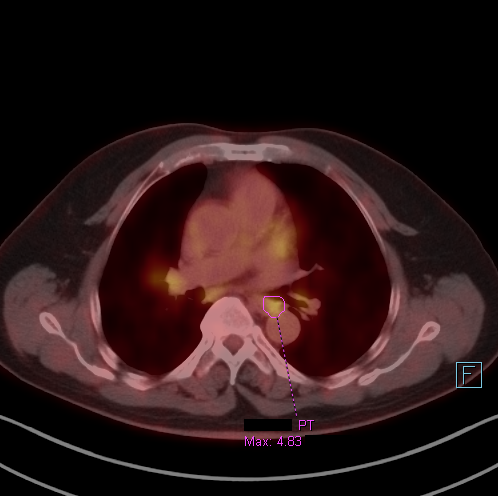
[im 15/19]
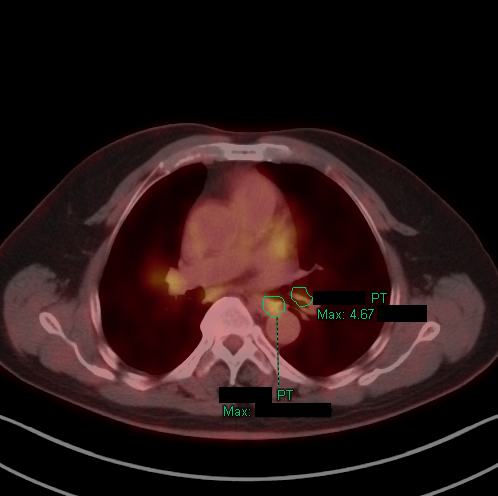
[im 16/19]
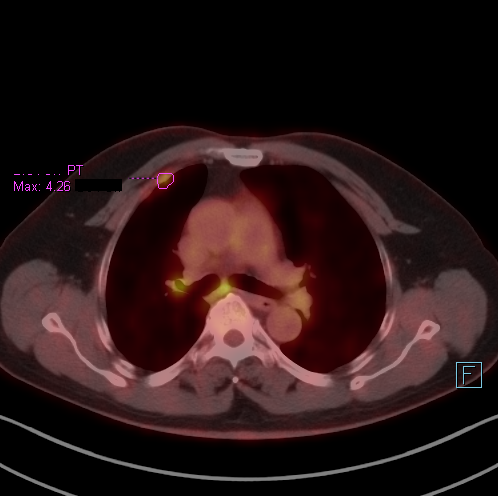
[im 17/19]
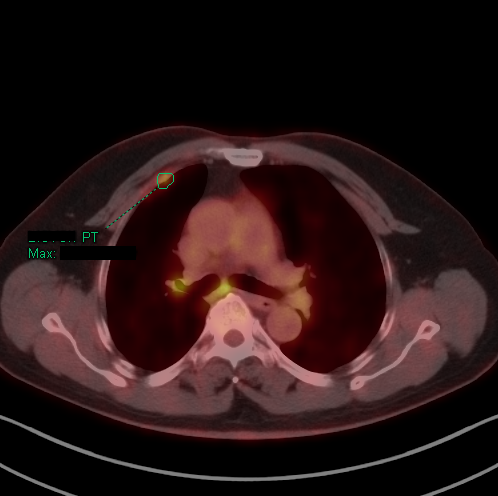
[im 18/19]
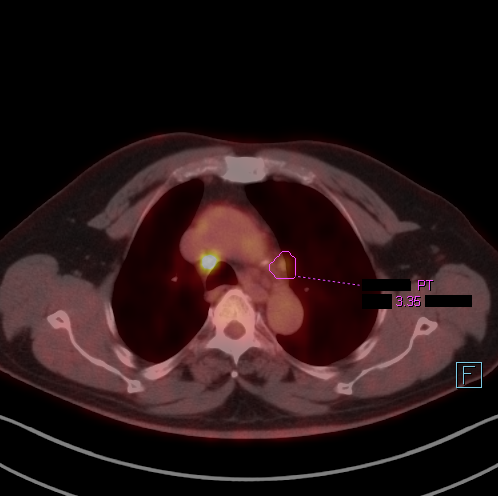
[im 19/19]
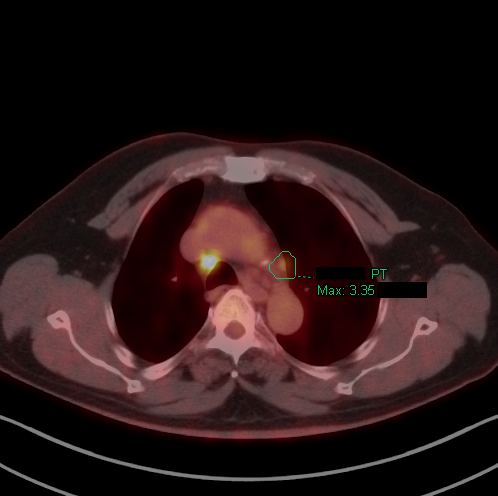

[19 of 19 positions shown; findings below may reference images not displayed]

FINDINGS: Mediastinal blood pool activity: SUV max

Liver activity: SUV max NA

NECK: Symmetric activity along the palatine tonsils, maximum SUV
on the right and 7.1 on the left, no suspicious mass lesion on the
CT data, appearance favors physiologic activity.

Incidental CT findings: none

CHEST: Centrally in the right middle lobe and corresponding to the
vicinity of the airway obstruction, there is hypermetabolic lesion
with maximum SUV of 8.3 extending along the bronchovascular
structures, with extension in the right middle lobe and along the
anterior margin of the bronchus intermedius. On axial images this
abnormal hypermetabolic activity measures about 1.7 by 1.1 cm. There
is also hypermetabolic activity in the collapsed right middle lobe
just distal to this lesion, maximum SUV 7.3, potentially
representing a separate nodule.

Hypermetabolic right paratracheal and subcarinal adenopathy
observed, the 1.0 cm right paratracheal node adjacent to the carina
on image 68 series 4 has maximum SUV of 13.5 and the small
subcarinal lymph nodes have maximum SUV of 6.9.

Small but hypermetabolic left infrahilar lymph nodes, maximum SUV
4.8

A small focus of right anterior pleural thickening at the level of
the carina has maximum SUV of 4.3 although this thickening is only
about 3 mm total thickness. A 5 mm AP window lymph node on image 68
series 4 has maximum SUV of 3.4.

Incidental CT findings: Coronary, aortic arch, and branch vessel
atherosclerotic vascular disease. Mild cardiomegaly. Persistent
complete atelectasis of the right middle lobe.

ABDOMEN/PELVIS: No significant abnormal hypermetabolic activity in
this region.

Incidental CT findings: Atherosclerosis is present, including
aortoiliac atherosclerotic disease.

SKELETON: No significant abnormal hypermetabolic activity in this
region.

Incidental CT findings: none
IMPRESSION: 1. Central right middle lobe hypermetabolic nodule with maximum SUV
8.3, along with adjacent hypermetabolic nodularity in the collapsed
right middle lobe, hypermetabolic ipsilateral and contralateral
mediastinal adenopathy, small hypermetabolic left infrahilar lymph
nodes, and a hypermetabolic focus along the right anterior pleura.
Malignancy is favored over entities such as sarcoidosis, tissue
diagnosis recommended.
2. No findings of metastatic disease to the abdomen/pelvis, neck, or
skeleton.
3. Other imaging findings of potential clinical significance: Aortic
Atherosclerosis ([id number]). Peripheral and coronary
atherosclerosis. Mild cardiomegaly.

## 2024-02-12 NOTE — Patient Instructions (Addendum)
 He will purchase Zantac 360 over the counter and take this twice daily for 7-10 days. Given GI cocktail in office. Main complaint seems to be  cough and epigastric pain. He thinks the very hot tea caused some inflammation of the lining of his stomach so assume he has gastritis. He can swallow OK. No fever or chills. His chest is clear.Has some upper respiratory congestion but flu and Covid test are negative.

## 2024-02-12 NOTE — Progress Notes (Signed)
 Patient Care Team: Perri Ronal PARAS, MD as PCP - General (Internal Medicine)  Visit Date: 02/12/24  Subjective:   Chief Complaint  Patient presents with   Cough   Vitals:   02/12/24 1405 02/12/24 1408 02/12/24 1424  BP: (!) 160/100 (!) 190/90 (!) 170/100   Patient PI:Sergio Lawson,Male DOB:Feb 06, 1956,68 y.o. FMW:[id number]   68 y.o.Male presents today for acute sick visit with Cough; Stomach Pain. 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 Says that his cough and abdominal pain started shortly after drinking some hot tea, and he's also had some congestion and rhinorrhea, but no vomiting, diarrhea, or shaking chills. Last BM was yesterday reportedly, and he's been able to keep foods (mainly been eating rice and noodles) down.  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HCC)     Allergies  Allergen Reactions   Codeine Nausea Only   Levaquin [Levofloxacin] Nausea Only    Family History  Problem Relation Age of Onset   Cancer Mother    Social History   Social History Narrative   Previously worked as a Location manager in [place], 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 Married - wife previously worked in a [employer] but is retired. Both he and his wife moved here from [place] in 1980 sponsored by [employer] - they still have family in [place] and take trips there. Enjoys gardening with his wife.   Review of Systems  Constitutional:  Negative for chills.  HENT:  Positive for congestion (& Rhinorrhea). Negative for sore throat.   Respiratory:  Positive for cough.   Gastrointestinal:  Positive for abdominal pain (soreness). Negative for diarrhea, nausea and vomiting.     Objective:  Vitals: BP (!) 170/100   Pulse 78   Temp 98.6 F (37  C)   Ht 5' 4 (1.626 m)   SpO2 97%   BMI 28.86 kg/m   Physical Exam Vitals and nursing note reviewed.  Constitutional:      General: He is not in acute distress.    Appearance: Normal appearance. He is not ill-appearing.  HENT:     Head: Normocephalic and atraumatic.     Right Ear: Hearing, tympanic membrane, ear canal and external ear normal.     Left Ear: Hearing, tympanic membrane, ear canal and external ear normal.     Mouth/Throat:     Mouth: Mucous membranes are moist.     Pharynx: Posterior oropharyngeal erythema present. No oropharyngeal exudate.  Pulmonary:     Effort: Pulmonary effort is normal.     Breath sounds: Normal breath sounds. No wheezing, rhonchi or rales.  Abdominal:     Palpations: Abdomen is soft. There is no mass.     Tenderness: There is no abdominal tenderness.  Lymphadenopathy:     Cervical: No cervical adenopathy.  Skin:    General: Skin is warm and dry.  Neurological:     Mental Status: He is alert and oriented to person, place, and time. Mental status is at baseline.  Psychiatric:        Mood and Affect: Mood normal.        Behavior: Behavior normal.        Thought Content: Thought content normal.        Judgment: Judgment normal.  Results:  Studies Obtained And Personally Reviewed By Me: Labs:     Component Value Date/Time   NA 138 01/22/2024 0935   K 4.4 01/22/2024 0935   CL 102 01/22/2024 0935   CO2 26 01/22/2024 0935   GLUCOSE 92 01/22/2024 0935   BUN 14 01/22/2024 0935   CREATININE 0.86 01/22/2024 0935   CALCIUM  8.9 01/22/2024 0935   PROT 7.6 01/22/2024 0935   AST 39 (H) 01/22/2024 0935   ALT 17 01/22/2024 0935   BILITOT 0.5 01/22/2024 0935    Lab Results  Component Value Date   WBC 5.9 01/22/2024   HGB 14.2 01/22/2024   HCT 46.8 01/22/2024   MCV 81.4 01/22/2024   PLT 180 01/22/2024   Lab Results  Component Value Date   CHOL 269 (H) 01/22/2024   HDL 60 01/22/2024   LDLCALC 187 (H) 01/22/2024   TRIG 97  01/22/2024   CHOLHDL 4.5 01/22/2024   Lab Results  Component Value Date   TSH 1.55 04/27/2021    Lab Results  Component Value Date   PSA 1.72 01/22/2024   PSA 1.68 04/27/2021     Assessment & Plan:   Cough; Stomach Pain: past medical history of Chronic Hepatitis B; Hx of Smoking; Pneumatocele of Lung; 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   Cough and abdominal pain started shortly after drinking some hot tea, and he's also had some congestion and rhinorrhea, but no vomiting, diarrhea, or shaking chills. Last BM was yesterday reportedly, and he's been able to keep foods (mainly been eating rice and noodles) down. Given 30 cc GI cocktail in-office. Recommended he take OTC Zantac 360 twice daily for 10-14 days - if after 5-7 days symptoms are not improving or are worsening, contact office for return appointment.   Covid and flu tests were negative  Elevated BP initially at 160/100, 3 minutes later 190/90, and 16 minutes later 170/100. Does not take anti-hypertensive medications, and am inclined to believe the heat of the day was contributing.Needs BP recheck when feeling better.  Rest and stay well hydrated.     I,Emily Lagle,acting as a Neurosurgeon for Ronal JINNY Hailstone, MD.,have documented all relevant documentation on the behalf of Ronal JINNY Hailstone, MD,as directed by  Ronal JINNY Hailstone, MD while in the presence of Ronal JINNY Hailstone, MD.   I, Ronal JINNY Hailstone, MD, have reviewed all documentation for this visit. The documentation on 02/12/24 for the exam, diagnosis, procedures, and orders are all accurate and complete.

## 2024-02-13 ENCOUNTER — Other Ambulatory Visit: Payer: Self-pay

## 2024-02-13 ENCOUNTER — Ambulatory Visit: Payer: Self-pay

## 2024-02-13 MED ORDER — AMOXICILLIN 500 MG PO CAPS: 500.0000 mg | ORAL_CAPSULE | Freq: Three times a day (TID) | ORAL | 0 refills | Status: DC

## 2024-02-13 MED ORDER — PANTOPRAZOLE SODIUM 40 MG PO TBEC: 40.0000 mg | DELAYED_RELEASE_TABLET | Freq: Every day | ORAL | 0 refills | Status: DC

## 2024-02-13 MED ORDER — AMOXICILLIN 500 MG PO CAPS: 500.0000 mg | ORAL_CAPSULE | Freq: Three times a day (TID) | ORAL | 0 refills | Status: AC

## 2024-02-13 NOTE — Addendum Note (Signed)
 Addended by: Gust Eugene P on: 02/13/2024 01:21 PM   Modules accepted: Orders

## 2024-02-13 NOTE — Addendum Note (Signed)
 Addended by: Almond Fitzgibbon P on: 02/13/2024 12:59 PM   Modules accepted: Orders

## 2024-02-13 NOTE — Telephone Encounter (Signed)
 FYI Only or Action Required?: Action required by provider: States could not find Zantac in pharmacies.  Would like medication called in.   Patient was last seen in primary care on 02/12/2024 by Perri Ronal PARAS, MD.  Called Nurse Triage reporting Sore Throat.  Symptoms began today.  Interventions attempted: Nothing.  Symptoms are: gradually worsening.  Triage Disposition: See PCP When Office is Open (Within 3 Days)  Patient/caregiver understands and will follow disposition?: No, wishes to speak with PCP          Copied from CRM [id number]. Topic: Clinical - Red Word Triage >> Feb 13, 2024  9:15 AM Ivette P wrote: [facility] that prompted transfer to Nurse Triage:  Throat hurts - coughing and hurt and sore inside. He says worsening.    Pt went to pharmacy to get Zintac Reason for Disposition  [1] Sore throat is the only symptom AND [2] present > 48 hours  Answer Assessment - Initial Assessment Questions 1. ONSET: When did the throat start hurting? (Hours or days ago)      Four days ago 2. SEVERITY: How bad is the sore throat? (Scale 1-10; mild, moderate or severe)     4/10 3. STREP EXPOSURE: Has there been any exposure to strep within the past week? If Yes, ask: What type of contact occurred?      denies 4.  VIRAL SYMPTOMS: Are there any symptoms of a cold, such as a runny nose, cough, hoarse voice or red eyes?      cough 5. FEVER: Do you have a fever? If Yes, ask: What is your temperature, how was it measured, and when did it start?     no 6. PUS ON THE TONSILS: Is there pus on the tonsils in the back of your throat?     no 7. OTHER SYMPTOMS: Do you have any other symptoms? (e.g., difficulty breathing, headache, rash)     no 8. PREGNANCY: Is there any chance you are pregnant? When was your last menstrual period?     na  Protocols used: Sore Throat-A-AH

## 2024-02-22 ENCOUNTER — Telehealth: Payer: Self-pay | Admitting: Internal Medicine

## 2024-03-06 ENCOUNTER — Other Ambulatory Visit: Payer: Self-pay | Admitting: Internal Medicine

## 2024-03-13 ENCOUNTER — Ambulatory Visit (INDEPENDENT_AMBULATORY_CARE_PROVIDER_SITE_OTHER)

## 2024-03-13 VITALS — BP 170/98 | HR 84 | Ht 62.0 in | Wt 169.0 lb

## 2024-03-13 DIAGNOSIS — Z87891 Personal history of nicotine dependence: Secondary | ICD-10-CM | POA: Diagnosis not present

## 2024-03-13 DIAGNOSIS — R590 Localized enlarged lymph nodes: Secondary | ICD-10-CM | POA: Diagnosis not present

## 2024-03-13 DIAGNOSIS — Z0000 Encounter for general adult medical examination without abnormal findings: Secondary | ICD-10-CM

## 2024-03-13 DIAGNOSIS — I10 Essential (primary) hypertension: Secondary | ICD-10-CM | POA: Diagnosis not present

## 2024-03-13 DIAGNOSIS — Z1211 Encounter for screening for malignant neoplasm of colon: Secondary | ICD-10-CM

## 2024-03-13 MED ORDER — OLMESARTAN MEDOXOMIL 20 MG PO TABS
20.0000 mg | ORAL_TABLET | Freq: Every day | ORAL | 1 refills | Status: DC
Start: 2024-03-13 — End: 2024-03-21

## 2024-03-13 NOTE — Patient Instructions (Addendum)
 Start Benicar  20 mg daily for high blood pressure readings and follow up here in 2 weeks. Referral back to Pulmonary to follow up on lung cancer screening and mediastinal lymphadenopathy.  Next appointment: Follow up in one year for your annual wellness visit.   Preventive Care 15 Years and Older, Male Preventive care refers to lifestyle choices and visits with your health care provider that can promote health and wellness. What does preventive care include? A yearly physical exam. This is also called an annual well check. Dental exams once or twice a year. Routine eye exams. Ask your health care provider how often you should have your eyes checked. Personal lifestyle choices, including: Daily care of your teeth and gums. Regular physical activity. Eating a healthy diet. Avoiding tobacco and drug use. Limiting alcohol use. Practicing safe sex. Taking low doses of aspirin every day. Taking vitamin and mineral supplements as recommended by your health care provider. What happens during an annual well check? The services and screenings done by your health care provider during your annual well check will depend on your age, overall health, lifestyle risk factors, and family history of disease. Counseling  Your health care provider may ask you questions about your: Alcohol use. Tobacco use. Drug use. Emotional well-being. Home and relationship well-being. Sexual activity. Eating habits. History of falls. Memory and ability to understand (cognition). Work and work Astronomer. Screening  You may have the following tests or measurements: Height, weight, and BMI. Blood pressure. Lipid and cholesterol levels. These may be checked every 5 years, or more frequently if you are over 60 years old. Skin check. Lung cancer screening. You may have this screening every year starting at age 71 if you have a 30-pack-year history of smoking and currently smoke or have quit within the past 15  years. Fecal occult blood test (FOBT) of the stool. You may have this test every year starting at age 37. Flexible sigmoidoscopy or colonoscopy. You may have a sigmoidoscopy every 5 years or a colonoscopy every 10 years starting at age 32. Prostate cancer screening. Recommendations will vary depending on your family history and other risks. Hepatitis C blood test. Hepatitis B blood test. Sexually transmitted disease (STD) testing. Diabetes screening. This is done by checking your blood sugar (glucose) after you have not eaten for a while (fasting). You may have this done every 1-3 years. Abdominal aortic aneurysm (AAA) screening. You may need this if you are a current or former smoker. Osteoporosis. You may be screened starting at age 34 if you are at high risk. Talk with your health care provider about your test results, treatment options, and if necessary, the need for more tests. Vaccines  Your health care provider may recommend certain vaccines, such as: Influenza vaccine. This is recommended every year. Tetanus, diphtheria, and acellular pertussis (Tdap, Td) vaccine. You may need a Td booster every 10 years. Zoster vaccine. You may need this after age 63. Pneumococcal 13-valent conjugate (PCV13) vaccine. One dose is recommended after age 9. Pneumococcal polysaccharide (PPSV23) vaccine. One dose is recommended after age 28. Talk to your health care provider about which screenings and vaccines you need and how often you need them. This information is not intended to replace advice given to you by your health care provider. Make sure you discuss any questions you have with your health care provider. Document Released: 08/07/2015 Document Revised: 03/30/2016 Document Reviewed: 05/12/2015 Elsevier Interactive Patient Education  2017 [employer].  Fall Prevention in the Home Falls can  cause injuries. They can happen to people of all ages. There are many things you can do to make your home  safe and to help prevent falls. What can I do on the outside of my home? Regularly fix the edges of walkways and driveways and fix any cracks. Remove anything that might make you trip as you walk through a door, such as a raised step or threshold. Trim any bushes or trees on the path to your home. Use bright outdoor lighting. Clear any walking paths of anything that might make someone trip, such as rocks or tools. Regularly check to see if handrails are loose or broken. Make sure that both sides of any steps have handrails. Any raised decks and porches should have guardrails on the edges. Have any leaves, snow, or ice cleared regularly. Use sand or salt on walking paths during winter. Clean up any spills in your garage right away. This includes oil or grease spills. What can I do in the bathroom? Use night lights. Install grab bars by the toilet and in the tub and shower. Do not use towel bars as grab bars. Use non-skid mats or decals in the tub or shower. If you need to sit down in the shower, use a plastic, non-slip stool. Keep the floor dry. Clean up any water that spills on the floor as soon as it happens. Remove soap buildup in the tub or shower regularly. Attach bath mats securely with double-sided non-slip rug tape. Do not have throw rugs and other things on the floor that can make you trip. What can I do in the bedroom? Use night lights. Make sure that you have a light by your bed that is easy to reach. Do not use any sheets or blankets that are too big for your bed. They should not hang down onto the floor. Have a firm chair that has side arms. You can use this for support while you get dressed. Do not have throw rugs and other things on the floor that can make you trip. What can I do in the kitchen? Clean up any spills right away. Avoid walking on wet floors. Keep items that you use a lot in easy-to-reach places. If you need to reach something above you, use a strong step  stool that has a grab bar. Keep electrical cords out of the way. Do not use floor polish or wax that makes floors slippery. If you must use wax, use non-skid floor wax. Do not have throw rugs and other things on the floor that can make you trip. What can I do with my stairs? Do not leave any items on the stairs. Make sure that there are handrails on both sides of the stairs and use them. Fix handrails that are broken or loose. Make sure that handrails are as long as the stairways. Check any carpeting to make sure that it is firmly attached to the stairs. Fix any carpet that is loose or worn. Avoid having throw rugs at the top or bottom of the stairs. If you do have throw rugs, attach them to the floor with carpet tape. Make sure that you have a light switch at the top of the stairs and the bottom of the stairs. If you do not have them, ask someone to add them for you. What else can I do to help prevent falls? Wear shoes that: Do not have high heels. Have rubber bottoms. Are comfortable and fit you well. Are closed at the toe.  Do not wear sandals. If you use a stepladder: Make sure that it is fully opened. Do not climb a closed stepladder. Make sure that both sides of the stepladder are locked into place. Ask someone to hold it for you, if possible. Clearly mark and make sure that you can see: Any grab bars or handrails. First and last steps. Where the edge of each step is. Use tools that help you move around (mobility aids) if they are needed. These include: Canes. Walkers. Scooters. Crutches. Turn on the lights when you go into a dark area. Replace any light bulbs as soon as they burn out. Set up your furniture so you have a clear path. Avoid moving your furniture around. If any of your floors are uneven, fix them. If there are any pets around you, be aware of where they are. Review your medicines with your doctor. Some medicines can make you feel dizzy. This can increase your  chance of falling. Ask your doctor what other things that you can do to help prevent falls. This information is not intended to replace advice given to you by your health care provider. Make sure you discuss any questions you have with your health care provider. Document Released: 05/07/2009 Document Revised: 12/17/2015 Document Reviewed: 08/15/2014 Elsevier Interactive Patient Education  2017 [employer].

## 2024-03-13 NOTE — Progress Notes (Signed)
 He is here for Medicare wellness visit and also to see me for blood pressure recheck and further recommendations. Also, would like to review hx of mediastinal adenopathy with Hx bronchoscopy with benign lymph node biopsies. Has not had recent lung cancer screening.Saw Dr. Gladis, Pulmonologist initially in 2023. Hx of mediastinal lymphadenopathy. He is a native of [place] but has lived in this country since the 1980s.   He says blood pressure readings at home have remained elevated 140-150 systolic.  He says diastolic has been around  the high 80s to 90. He is concerned about these readings. I think he has developed essential hypertension and is agreeable to starting medication today. Given his current readings, I have decided to start him on Benicar  20 mg daily with follow up. In July, his blood pressure was 170/100. At the time he was ill with cough and abdominal pain. Said cough had started after drinking hot tea.  Blood pressure today remains elevated 146-150 systolic and 90 diastolic  I am starting him on benicar  20 mg daily with follow up in 2 weeks. Will need B-met at that time.  His care here has been infrequent.  He has a history of mediastinal lymphadenopathy seen at [facility] pulmonary in 2023 and 2024.  He has quit smoking.  He had flexible bronchoscopy and endobronchial ultrasound by Dr. Gladis, pulmonologist in March 2023.  Patient had an extrinsically compressed right middle lobe takeoff, anthracotic pigmentation right upper lobe and right middle lobe takeoff.  EBNA showed fine-needle aspiration of one lymph node with no malignant cells identified and another lymph node also underwent fine-needle aspiration with no no malignant cells identified.  Last visit to pulmonary was March 2024.  He is needed to be re-screened for lung cancer and we will make referral back to Pulmonary for him.  MJB, MD  I have reviewed and agree with the above Annual Wellness Visit documentation.  Ronal Norleen Hailstone,  MD. Internal Medicine 03/13/2024

## 2024-03-13 NOTE — Progress Notes (Addendum)
 Subjective:   Sergio Lawson is a 68 y.o. male who presents for an Initial Medicare Annual Wellness Visit.  Visit Complete: 03/13/2024  Patient Medicare AWV questionnaire was completed by the patient on 03/13/2024; I have confirmed that all information answered by patient is correct and no changes since this date.  Cardiac Risk Factors include: advanced age (>55men, >45 women);male gender;hypertension;dyslipidemia     Objective:    Today's Vitals   03/13/24 0939 03/13/24 0950 03/13/24 0954  BP: (!) 170/100 (!) 150/90 (!) 170/98  Pulse: 84    SpO2: 97%    Weight: 169 lb (76.7 kg)    Height: 5' 2 (1.575 m)    PainSc: 0-No pain     Body mass index is 30.91 kg/m.     03/13/2024    9:39 AM 05/17/2021    9:24 AM  Advanced Directives  Does Patient Have a Medical Advance Directive? Yes No  Type of Advance Directive Living will;Healthcare Power of Attorney   Copy of Healthcare Power of Attorney in Chart? No - copy requested   Would patient like information on creating a medical advance directive?  No - Patient declined    Current Medications (verified) Outpatient Encounter Medications as of 03/13/2024  Medication Sig   olmesartan  (BENICAR ) 20 MG tablet Take 1 tablet (20 mg total) by mouth daily.   pantoprazole  (PROTONIX ) 40 MG tablet TAKE 1 TABLET BY MOUTH EVERY DAY   rosuvastatin  (CRESTOR ) 5 MG tablet Take 1 tablet (5 mg total) by mouth daily.   No facility-administered encounter medications on file as of 03/13/2024.    Allergies (verified) Codeine and Levaquin [levofloxacin]   History: 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HCC)    Past Surgical History:  Procedure Laterality Date   BRONCHIAL BIOPSY  05/17/2021   Procedure: BRONCHIAL BIOPSIES;  Surgeon: Mannam, Praveen, MD;  Location: [facility];  Service: Cardiopulmonary;;   BRONCHIAL BRUSHINGS  05/17/2021   Procedure: BRONCHIAL BRUSHINGS;  Surgeon: Mannam, Praveen, MD;  Location: [facility];   Service: Cardiopulmonary;;   BRONCHIAL NEEDLE ASPIRATION BIOPSY  09/30/2021   Procedure: BRONCHIAL NEEDLE ASPIRATION BIOPSIES;  Surgeon: Gladis Leonor HERO, MD;  Location: [facility];  Service: Pulmonary;;   BRONCHIAL WASHINGS  05/17/2021   Procedure: BRONCHIAL WASHINGS;  Surgeon: Mannam, Praveen, MD;  Location: [facility];  Service: Cardiopulmonary;;   ENDOBRONCHIAL ULTRASOUND N/A 09/30/2021   Procedure: ENDOBRONCHIAL ULTRASOUND;  Surgeon: Gladis Leonor HERO, MD;  Location: [facility];  Service: Pulmonary;  Laterality: N/A;   FINE NEEDLE ASPIRATION  05/17/2021   Procedure: FINE NEEDLE ASPIRATION (FNA) LINEAR;  Surgeon: Theophilus Roosevelt, MD;  Location: [facility];  Service: Cardiopulmonary;;   LUMBAR DISC SURGERY  2011   x 2012   SPINE SURGERY  07/25/2009   herniated disc   VIDEO BRONCHOSCOPY  09/30/2021   Procedure: VIDEO BRONCHOSCOPY WITHOUT FLUORO;  Surgeon: Gladis Leonor HERO, MD;  Location: [facility];  Service: Pulmonary;;   VIDEO BRONCHOSCOPY WITH ENDOBRONCHIAL ULTRASOUND N/A 05/17/2021   Procedure: VIDEO BRONCHOSCOPY WITH ENDOBRONCHIAL ULTRASOUND;  Surgeon: Mannam, Praveen, MD;  [facility];  Service: Cardiopulmonary;  Laterality: N/A;   Family History  Problem Relation Age of Onset   Cancer Mother    Social History   Socioeconomic History   Marital status: Single    Spouse name: Not on file   Number of children: Not on file   Years of education: Not on file   Highest education level: GED or equivalent  Occupational History  Not on file  Tobacco Use   Smoking status: Former    Current packs/day: 0.00    Average packs/day: 0.5 packs/day for 10.0 years (5.0 ttl pk-yrs)    Types: Cigarettes    Start date: 07/26/1999    Quit date: 07/25/2009    Years since quitting: 14.6   Smokeless tobacco: Never  Vaping Use   Vaping status: Never Used  Substance and Sexual Activity   Alcohol use: Yes    Alcohol/week: 1.0 standard drink of alcohol    Types: 1 Cans of  beer per week   Drug use: Never   Sexual activity: Not on file  Other Topics Concern   Not on file  Social History Narrative   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 Married - wife previously worked in a [employer] but is retired. Both he and his wife moved here from [place] in 1980 sponsored by [employer] - they still have family in [place] and take trips there. Enjoys gardening with his wife.   Social Drivers of Corporate investment banker Strain: Low Risk  (03/13/2024)   Overall Financial Resource Strain (CARDIA)    Difficulty of Paying Living Expenses: Not hard at all  Food Insecurity: No Food Insecurity (03/13/2024)   Hunger Vital Sign    Worried About Running Out of Food in the Last Year: Never true    Ran Out of Food in the Last Year: Never true  Transportation Needs: No Transportation Needs (03/13/2024)   PRAPARE - Administrator, Civil Service (Medical): No    Lack of Transportation (Non-Medical): No  Physical Activity: Sufficiently Active (03/13/2024)   Exercise Vital Sign    Days of Exercise per Week: 7 days    Minutes of Exercise per Session: 40 min  Stress: No Stress Concern Present (03/13/2024)   [employer] of Occupational Health - Occupational Stress Questionnaire    Feeling of Stress: Not at all  Social Connections: Socially Integrated (03/13/2024)   Social Connection and Isolation Panel    Frequency of Communication with Friends and Family: More Jomar three times a week    Frequency of Social Gatherings with Friends and Family: More Dearis three times a week    Attends Religious Services: 1 to 4 times per year    Active Member of [employer] or Organizations: No    Attends Engineer, structural: More Liliana 4 times per year    Marital Status: Married    Tobacco Counseling Counseling given: No   Clinical  Intake:  Pre-visit preparation completed: Yes  Pain : No/denies pain Pain Score: 0-No pain     BMI - recorded: 30.91 Nutritional Status: BMI > 30  Obese Nutritional Risks: None Diabetes: No  How often do you need to have someone help you when you read instructions, pamphlets, or other written materials from your doctor or pharmacy?: 1 - Never  Interpreter Needed?: No  Information entered by :: Kathlynn Porto, CMA   Activities of Daily Living    03/13/2024    9:25 AM 01/18/2024    2:09 PM  In your present state of health, do you have any difficulty performing the following activities:  Hearing? 1 1  Vision? 0 0  Difficulty concentrating or making decisions? 0 0  Walking or climbing stairs? 0 0  Dressing or bathing? 0 0  Doing errands,  shopping? 0 0  Preparing Food and eating ? N N  Using the Toilet? N N  In the past six months, have you accidently leaked urine? N N  Do you have problems with loss of bowel control? N N  Managing your Medications? N N  Managing your Finances? N N  Housekeeping or managing your Housekeeping? N N    Patient Care Team: Perri Ronal PARAS, MD as PCP - General (Internal Medicine)  Indicate any recent Medical Services you may have received from other [facility] providers in the past year (date may be approximate).     Assessment:   This is a routine wellness examination for Christopher.  Hearing/Vision screen Hearing Screening   500Hz  1000Hz  2000Hz  3000Hz   Right ear Pass Pass Pass Pass  Left ear Pass Pass Pass Pass   Vision Screening   Right eye Left eye Both eyes  Without correction     With correction 20/15 20/15 20/20      Goals Addressed   None    Depression Screen    03/13/2024    9:25 AM 01/18/2024    2:12 PM 04/26/2021   12:00 PM  PHQ 2/9 Scores  PHQ - 2 Score 0 0 0    Fall Risk    03/13/2024    9:24 AM 01/18/2024    2:11 PM 01/17/2024    2:56 PM  Fall Risk   Falls in the past year? 0 0 0  Number falls in past yr: 0 0    Injury with Fall? 0 0 0  Risk for fall due to : No Fall Risks No Fall Risks   Follow up Falls prevention discussed;Education provided;Falls evaluation completed Falls evaluation completed     MEDICARE RISK AT HOME: Medicare Risk at Home Any stairs in or around the home?: No If so, are there any without handrails?: Yes Home free of loose throw rugs in walkways, pet beds, electrical cords, etc?: Yes Adequate lighting in your home to reduce risk of falls?: Yes Life alert?: No Use of a cane, walker or w/c?: No Grab bars in the bathroom?: Yes Shower chair or bench in shower?: No Elevated toilet seat or a handicapped toilet?: No  TIMED UP AND GO:  Was the test performed? No    Cognitive Function:    01/18/2024    2:12 PM  MMSE - Mini Mental State Exam  Orientation to time 5  Orientation to Place 5  Registration 3  Attention/ Calculation 5  Recall 3  Language- name 2 objects 2  Language- repeat 1  Language- follow 3 step command 3  Language- read & follow direction 1  Write a sentence 1  Copy design 1  Total score 30        03/13/2024    9:43 AM 01/18/2024    2:12 PM  6CIT Screen  What Year? 0 points 0 points  What month? 0 points   What time? 0 points 0 points  Count back from 20 0 points 0 points  Months in reverse 0 points 0 points  Repeat phrase 0 points 0 points  Total Score 0 points     Immunizations Immunization History  Administered Date(s) Administered   Fluad Quad(high Dose 65+) 05/04/2022   Hepatitis A, Adult 10/05/2004   Influenza Inj Mdck Quad Pf 04/02/2019   Influenza Split 05/27/2011   Influenza, High Dose Seasonal PF 04/06/2023   Influenza,inj,Quad PF,6+ Mos 05/01/2015, 07/24/2018   Influenza-Unspecified 04/06/2021   PFIZER(Purple Top)SARS-COV-2 Vaccination 10/16/2019,  11/11/2019, 06/02/2020   PNEUMOCOCCAL CONJUGATE-20 10/14/2020   Pfizer(Comirnaty)Fall Seasonal Vaccine 12 years and older 05/07/2022   Td (Adult),5 Lf Tetanus Toxid,  Preservative Free 10/05/2004   Tdap 07/25/1990, 01/24/2024   Typhoid Inactivated 10/05/2004   Zoster Recombinant(Shingrix) 02/07/2024    TDAP status: Up to date  Flu Vaccine status: Due, Education has been provided regarding the importance of this vaccine. Advised may receive this vaccine at local pharmacy or Health Dept. Aware to provide a copy of the vaccination record if obtained from local pharmacy or Health Dept. Verbalized acceptance and understanding.  Pneumococcal vaccine status: Up to date  Covid-19 vaccine status: Information provided on how to obtain vaccines.   Qualifies for Shingles Vaccine? Yes   Zostavax completed No   Shingrix Completed?: No.    Education has been provided regarding the importance of this vaccine. Patient has been advised to call insurance company to determine out of pocket expense if they have not yet received this vaccine. Advised may also receive vaccine at local pharmacy or Health Dept. Verbalized acceptance and understanding.  Screening Tests Health Maintenance  Topic Date Due   Hepatitis C Screening  Never done   Colonoscopy  Never done   COVID-19 Vaccine (5 - 2024-25 season) 03/26/2023   INFLUENZA VACCINE  02/23/2024   Zoster Vaccines- Shingrix (2 of 2) 04/03/2024   Medicare Annual Wellness (AWV)  01/17/2025   DTaP/Tdap/Td (4 - Td or Tdap) 01/23/2034   Pneumococcal Vaccine: 50+ Years  Completed   HPV VACCINES  Aged Out   Meningococcal B Vaccine  Aged Out   Hepatitis B Vaccines 19-59 Average Risk  Discontinued    Health Maintenance  Health Maintenance Due  Topic Date Due   Hepatitis C Screening  Never done   Colonoscopy  Never done   COVID-19 Vaccine (5 - 2024-25 season) 03/26/2023   INFLUENZA VACCINE  02/23/2024    Colorectal cancer screening: Referral to GI placed 03/13/2024. Pt aware the office will call re: appt.  Lung Cancer Screening: (Low Dose CT Chest recommended if Age 52-80 years, 20 pack-year currently smoking OR have  quit w/in 15years.) does not qualify.   Additional Screening:  Hepatitis C Screening: does not qualify; Completed   Vision Screening: Recommended annual ophthalmology exams for early detection of glaucoma and other disorders of the eye. Is the patient up to date with their annual eye exam?  Yes  Who is the provider or what is the name of the office in which the patient attends annual eye exams? [facility]  If pt is not established with a provider, would they like to be referred to a provider to establish care? No .   Dental Screening: Recommended annual dental exams for proper oral hygiene  Community Resource Referral / Chronic Care Management: CRR required this visit?  No   CCM required this visit?  No    Plan:     I have personally reviewed and noted the following in the patient's chart:   Medical and social history Use of alcohol, tobacco or illicit drugs  Current medications and supplements including opioid prescriptions. Patient is not currently taking opioid prescriptions. Functional ability and status Nutritional status Physical activity Advanced directives List of other physicians Hospitalizations, surgeries, and ER visits in previous 12 months Vitals Screenings to include cognitive, depression, and falls Referrals and appointments  In addition, I have reviewed and discussed with patient certain preventive protocols, quality metrics, and best practice recommendations. A written personalized care plan for preventive services  as well as general preventive health recommendations were provided to patient.     Araceli Zelda, CMA   03/13/2024   After Visit Summary: (In Person-Printed) AVS printed and given to the patient  I have reviewed and agree with the above Annual Wellness Visit documentation.  Ronal Norleen Hailstone, MD. Internal Medicine 03/13/2024

## 2024-03-18 NOTE — Progress Notes (Addendum)
 Patient Care Team: Perri Ronal PARAS, MD as PCP - General (Internal Medicine)  Visit Date: 03/21/24  Subjective:   Chief Complaint  Patient presents with   Blood Pressure Check   Vitals:   03/21/24 0922 03/21/24 0927 03/21/24 0943  BP: (!) 160/100 (!) 160/100 (!) 160/100   Patient PI:Sergio Lawson,Male DOB:10/17/1955,68 y.o. FMW:[id number]   68 y.o.Male presents today for 7 day follow-up of elevated blood pressure for which he was seen initially on 03/13/2024. Patient has a past medical history of elevated blood pressure, Mediastinal lymphadenopathy and Pneumatocele of the lung.  Had endobronchial ultrasound with biopsy by Pulmonologist 2023 which was negative for malignancy.  Noted to have 10 pack year smoking hx in 2022. 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 Had pneumatocele post procedure.  Seen July 2025 with cough and stomach pain. BP was markedly elevated then at 160/100, 190/90 and 170/100.   He reported that his blood pressure reading at home have remained elevated at 140-150 systolic His blood pressure at his last visit  03/13/2024 was 170/98. He is prescribed Benicar  20 mg daily. His 03/21/2024 blood pressure is  currently 160/100. Benicar  increased from 20 mg to 40 mg told to take 2 of remaining medication before picking up new medication.          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HCC)     Allergies  Allergen Reactions   Codeine Nausea Only   Levaquin [Levofloxacin] Nausea Only   Immunization History  Administered Date(s) Administered   Fluad Quad(high Dose 65+) 05/04/2022   Hepatitis A, Adult 10/05/2004   INFLUENZA, HIGH DOSE SEASONAL PF 04/06/2023   Influenza Inj Mdck Quad Pf 04/02/2019   Influenza Split 05/27/2011   Influenza,inj,Quad PF,6+ Mos 05/01/2015, 07/24/2018   Influenza-Unspecified 04/06/2021   PFIZER(Purple Top)SARS-COV-2  Vaccination 10/16/2019, 11/11/2019, 06/02/2020   PNEUMOCOCCAL CONJUGATE-20 10/14/2020   Pfizer(Comirnaty)Fall Seasonal Vaccine 12 years and older 05/07/2022   Td (Adult),5 Lf Tetanus Toxid, Preservative Free 10/05/2004   Tdap 07/25/1990, 01/24/2024   Typhoid Inactivated 10/05/2004   Zoster Recombinant(Shingrix) 02/07/2024   Past Surgical History:  Procedure Laterality Date   BRONCHIAL BIOPSY  05/17/2021   Procedure: BRONCHIAL BIOPSIES;  Surgeon: Theophilus Roosevelt, MD;  [facility];  Service: Cardiopulmonary;;   BRONCHIAL BRUSHINGS  05/17/2021   Procedure: BRONCHIAL BRUSHINGS;  Surgeon: Theophilus Roosevelt, MD;  [facility];  Service: Cardiopulmonary;;   BRONCHIAL NEEDLE ASPIRATION BIOPSY  09/30/2021   Procedure: BRONCHIAL NEEDLE ASPIRATION BIOPSIES;  Surgeon: Gladis Leonor HERO, MD;  Location: [facility];  Service: Pulmonary;;   BRONCHIAL WASHINGS  05/17/2021   Procedure: BRONCHIAL WASHINGS;  Surgeon: Theophilus Roosevelt, MD;  [facility];  Service: Cardiopulmonary;;   ENDOBRONCHIAL ULTRASOUND N/A 09/30/2021   Procedure: ENDOBRONCHIAL ULTRASOUND;  Surgeon: Gladis Leonor HERO, MD;  Location: [facility];  Service: Pulmonary;  Laterality: N/A;   FINE NEEDLE ASPIRATION  05/17/2021   Procedure: FINE NEEDLE ASPIRATION (FNA) LINEAR;  Surgeon: Theophilus Roosevelt, MD;  [facility];  Service: Cardiopulmonary;;   LUMBAR DISC SURGERY  2011   x 2012   SPINE SURGERY  07/25/2009   herniated disc   VIDEO BRONCHOSCOPY  09/30/2021   Procedure: VIDEO BRONCHOSCOPY WITHOUT FLUORO;  Surgeon: Gladis Leonor HERO, MD;  Location: [facility];  Service: Pulmonary;;   VIDEO BRONCHOSCOPY WITH ENDOBRONCHIAL ULTRASOUND N/A 05/17/2021   Procedure: VIDEO BRONCHOSCOPY WITH ENDOBRONCHIAL ULTRASOUND;  Surgeon:  Mannam, Praveen, MD;  Location: [facility];  Service: Cardiopulmonary;  Laterality: N/A;    Family History  Problem Relation Age of Onset   Cancer Mother    Social History    Social History Narrative   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 Married - wife previously worked in a [employer] but is retired. Both he and his wife moved here from [place] in 1980 sponsored by [employer] - they still have family in [place] and take trips there. Enjoys gardening with his wife.   Review of Systems  Constitutional:  Negative for fever and malaise/fatigue.  HENT:  Negative for congestion.   Eyes:  Negative for blurred vision.  Respiratory:  Negative for cough and shortness of breath.   Cardiovascular:  Negative for chest pain, palpitations and leg swelling.  Gastrointestinal:  Negative for vomiting.  Musculoskeletal:  Negative for back pain.  Skin:  Negative for rash.  Neurological:  Negative for loss of consciousness and headaches.     Objective:  Vitals: BP (!) 160/100   Pulse 77   Ht 5' 2 (1.575 m)   Wt 168 lb (76.2 kg)   SpO2 98%   BMI 30.73 kg/m   Physical Exam Vitals and nursing note reviewed.  Constitutional:      General: He is not in acute distress.    Appearance: Normal appearance. He is not ill-appearing.  HENT:     Head: Normocephalic and atraumatic.  Pulmonary:     Effort: Pulmonary effort is normal.     Breath sounds: Normal breath sounds.  Skin:    General: Skin is warm and dry.  Neurological:     Mental Status: He is alert and oriented to person, place, and time. Mental status is at baseline.  Psychiatric:        Mood and Affect: Mood normal.        Behavior: Behavior normal.        Thought Content: Thought content normal.        Judgment: Judgment normal.     Results:  Studies Obtained And Personally Reviewed By Me:    Labs:  CBC w/ Differential Lab Results  Component Value Date   WBC 5.9 01/22/2024   RBC 5.75 01/22/2024   HGB 14.2 01/22/2024   HCT 46.8 01/22/2024   PLT 180  01/22/2024   MCV 81.4 01/22/2024   MCH 24.7 (L) 01/22/2024   MCHC 30.3 (L) 01/22/2024   RDW 14.8 01/22/2024   MPV 11.0 01/22/2024   LYMPHSABS 1.1 09/16/2021   MONOABS 0.6 09/16/2021   BASOSABS 30 01/22/2024    Comprehensive Metabolic Panel Lab Results  Component Value Date   NA 138 01/22/2024   K 4.4 01/22/2024   CL 102 01/22/2024   CO2 26 01/22/2024   GLUCOSE 92 01/22/2024   BUN 14 01/22/2024   CREATININE 0.86 01/22/2024   CALCIUM  8.9 01/22/2024   PROT 7.6 01/22/2024   AST 39 (H) 01/22/2024   ALT 17 01/22/2024   BILITOT 0.5 01/22/2024   EGFR 95 04/27/2021   Lipid Panel  Lab Results  Component Value Date   CHOL 269 (H) 01/22/2024   HDL 60 01/22/2024   LDLCALC 187 (H) 01/22/2024   TRIG 97 01/22/2024   A1c No results found for: HGBA1C  TSH Lab Results  Component Value Date   TSH 1.55 04/27/2021   PSA  Lab Results  Component Value Date   PSA 1.72 01/22/2024   PSA 1.68 04/27/2021     No results found for any visits on 03/21/24. Assessment & Plan:  Essential Hypertension. Was started on Benicar (olmesartan ) 20 mg at last visit. BP remains elevated.  Meds ordered this encounter  Medications   olmesartan  (BENICAR ) 40 MG tablet    Sig: Take 1 tablet (40 mg total) by mouth daily.    Dispense:  90 tablet    Refill:  0   Essential HTN:  He reported that his blood pressure readings at home have remained elevated at 140-150 systolic  despite taking Olmesartan  20 mg daily.His blood pressure at his last visit on  03/13/2024 was 170/98.   On 03/21/2024 blood pressure is 160/100. He has been taking olmesartan  20 mg daily. Increase  olmesartan  to 40 mg daily. Return Sept 11th for follow up. Consider checking B-met at that time.   Return in about 2 weeks (around 04/04/2024).   IRonal JINNY Hailstone, MD, have reviewed all documentation for this visit. The documentation on 03/21/2024 for the exam, diagnosis, procedures, and orders are all accurate and complete.       I,Emily  Lagle,acting as a Neurosurgeon for Ronal JINNY Hailstone, MD.,have documented all relevant documentation on the behalf of Ronal JINNY Hailstone, MD,as directed by  Ronal JINNY Hailstone, MD while in the presence of Ronal JINNY Hailstone, MD.  I, Ronal JINNY Hailstone, MD, have reviewed all documentation for this visit. The documentation on 03/21/2024 for the exam, diagnosis, procedures, and orders are all accurate and complete.

## 2024-03-21 ENCOUNTER — Ambulatory Visit: Admitting: Internal Medicine

## 2024-03-21 ENCOUNTER — Encounter: Payer: Self-pay | Admitting: Internal Medicine

## 2024-03-21 VITALS — BP 160/100 | HR 77 | Ht 62.0 in | Wt 168.0 lb

## 2024-03-21 DIAGNOSIS — I10 Essential (primary) hypertension: Secondary | ICD-10-CM | POA: Diagnosis not present

## 2024-03-21 MED ORDER — OLMESARTAN MEDOXOMIL 40 MG PO TABS: 40.0000 mg | ORAL_TABLET | Freq: Every day | ORAL | 0 refills | Status: DC

## 2024-03-21 NOTE — Patient Instructions (Addendum)
 Blood pressure reading is not optimal today. Increase Benicar (olmesartan ) to 40 mg daily. Return in 2 weeks. Take 2 tabs daily at one time of the prescription you have left which is 20 mg tabs. Then start 40 mg tabs daily. Take blood pressure medication in the mornings after you get up. I have sent in new prescription. Return in 2 weeks for follow up. It would be helpful if you could check your blood pressure at home. We suggest purchasing a home blood pressure cuff at the pharmacy. Ask them for help in selecting a good brand that is easy to use such as Omron.

## 2024-04-04 ENCOUNTER — Encounter: Payer: Self-pay | Admitting: Internal Medicine

## 2024-04-04 ENCOUNTER — Ambulatory Visit (INDEPENDENT_AMBULATORY_CARE_PROVIDER_SITE_OTHER): Admitting: Internal Medicine

## 2024-04-04 VITALS — BP 180/98 | HR 63 | Ht 62.0 in | Wt 166.0 lb

## 2024-04-04 DIAGNOSIS — I10 Essential (primary) hypertension: Secondary | ICD-10-CM

## 2024-04-04 DIAGNOSIS — B181 Chronic viral hepatitis B without delta-agent: Secondary | ICD-10-CM

## 2024-04-04 DIAGNOSIS — Z9889 Other specified postprocedural states: Secondary | ICD-10-CM

## 2024-04-04 DIAGNOSIS — Z87891 Personal history of nicotine dependence: Secondary | ICD-10-CM | POA: Diagnosis not present

## 2024-04-04 DIAGNOSIS — R590 Localized enlarged lymph nodes: Secondary | ICD-10-CM | POA: Diagnosis not present

## 2024-04-04 MED ORDER — AMLODIPINE BESYLATE 5 MG PO TABS: 5.0000 mg | ORAL_TABLET | Freq: Every day | ORAL | 0 refills | Status: DC

## 2024-04-04 NOTE — Progress Notes (Addendum)
 Patient Care Team: Perri Ronal PARAS, MD as PCP - General (Internal Medicine)  Visit Date: 04/04/24  Subjective:    Patient ID: Sergio Lawson , Male   DOB: [birth year], 68 y.o.    MRN: [id number]   68 y.o. Male presents today for 2 week follow up for Essential hypertension.  Currently on olmesartan  40 mg daily.  He is also on low-dose Crestor  5 mg daily for hyperlipidemia.  Lipid panel in June showed total cholesterol of 269 with an LDL cholesterol of 187.  HDL was 60 and triglycerides were 97.  BP high today 180/110.150/110.180/98.  He was seen here in June for welcome to Medicare physical examination.    History of chronic back pain treated with Tylenol.  Had herniated lumbar disc at L4-L5 in 2006 underwent surgery but has had  intermittent back pain and radiculopathy.    History of smoking half pack daily for 20 years but quit in 2011.  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  Had bronchoscopy and has been followed by pulmonary with annual chest x-rays.  Both mass and lymphadenopathy have remained stable.  Last saw nurse practitioner in pulmonary office March 2024 for mediastinal lymphadenopathy and hilar mass.  In November 2022 underwent endobronchial ultrasound and needle aspirate of right hilar mass.  BAL revealed only strep salivarius.  Did have pneumatocele postprocedure.  Was treated with cefpodoxime  for 7 days by Dr. Theophilus.    Does not look like he has had recent follow-up since September 2024.  At that time he had persistent right middle lobe atelectasis on chest CT possibly due to central obstructing lesion which could further be evaluated by bronchoscopy.  Had stable minimally prominent mediastinal nodes. Referral back to Pulmonary to see what is needed for follow up at this time from a Pulmonary standpoint.  Fine needle aspiration of lymph node 3/23- showed no malignant cells.    History of Essential hypertension treated with Benicar  40 mg daily. Blood pressure  today is hypertensive at 180/98. His 03/21/2024 blood pressure was 160/100. He says he checks his blood pressure every two days and that his systolic blood pressure remains around 150. Amlodipine  5 mg daily was prescribed. He is to take that along with Benicar  40 mg daily.    History of Chronic Back Pain treated with Tylenol [phone] mg every 6 hours as needed for mild pain, and he was previously on disability for this, but is now retired. In 2006 he had right leg radiculopathy, was found to have a large ruptured disc at L4-5, and subsequently underwent surgery, but afterwards continued to have persistent back pain and radiculopathy.    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 Both mass and lymphadenopathy have remained stable throughout the years.    History of Chronic Hepatitis B in 1992 seen by Dr. Dyane and had a liver biopsy showing mild chronic active hepatitis. His liver enzymes did improve, and Dr. Dyane thought there may be a component of alcoholic liver disease, but patient denied drinking. When Interferon treatment was offered, patient declined.     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 Married - wife previously worked in a [employer] but is retired. Both he and his wife moved here from [place] in 1980 sponsored by [employer] - they still have family in [place] and take trips there. Enjoys gardening with his wife.      Review of  Systems  Cardiovascular:  Negative for leg swelling.        Objective:   Vitals: BP (!) 180/98   Pulse 63   Ht 5' 2 (1.575 m)   Wt 166 lb (75.3 kg)   SpO2 97%   BMI 30.36 kg/m       Results:       Labs:       Component Value Date/Time   NA 138 01/22/2024 0935   K 4.4 01/22/2024 0935   CL 102 01/22/2024 0935   CO2 26 01/22/2024 0935   GLUCOSE 92 01/22/2024 0935   BUN 14 01/22/2024 0935   CREATININE 0.86 01/22/2024 0935   CALCIUM  8.9 01/22/2024 0935   PROT 7.6 01/22/2024 0935   AST 39 (H) 01/22/2024 0935   ALT 17 01/22/2024 0935   BILITOT 0.5 01/22/2024 0935     Lab Results  Component Value Date   WBC 5.9 01/22/2024   HGB 14.2 01/22/2024   HCT 46.8 01/22/2024   MCV 81.4 01/22/2024   PLT 180 01/22/2024    Lab Results  Component Value Date   CHOL 269 (H) 01/22/2024   HDL 60 01/22/2024   LDLCALC 187 (H) 01/22/2024   TRIG 97 01/22/2024   CHOLHDL 4.5 01/22/2024       Lab Results  Component Value Date   TSH 1.55 04/27/2021     Lab Results  Component Value Date   PSA 1.72 01/22/2024   PSA 1.68 04/27/2021      Assessment & Plan:    Essential Hypertension: treated with Benicar  40 mg daily. Blood pressure today is hypertensive at 180/98.His 03/21/2024 blood pressure was 160/100. He says he checks his blood pressure every two days and that his systolic blood pressure remains around 150.   Amlodipine  5 mg daily was prescribed. He is to take that along with Benicar  40 mg daily.  May need diuretic.  Follow up in 10 days (04/15/2024)  Should take BP meds at least one hour before coming to office.  Abnormal Chest CT. Endobronchial bx Sept 2023. Chest CT shoewed stable cutoff right  middle lobe bronchus with complete collapse of right middle lobe and mediastinal adenopathy.     I,Makayla C Reid,acting as a scribe for Ronal JINNY Hailstone, MD.,have documented all relevant documentation on the behalf of Ronal JINNY Hailstone, MD,as directed by  Ronal JINNY Hailstone, MD  while in the presence of Ronal JINNY Hailstone, MD.   I, Ronal JINNY Hailstone, MD, have reviewed all documentation for this visit. The documentation on 04/04/2024 for the exam, diagnosis, procedures, and orders are all accurate and complete.

## 2024-04-04 NOTE — Patient Instructions (Addendum)
 Adding amlodipine  5 mg daily to olmesartan . Needs B-met with next visit. Return October 9th for office visit

## 2024-04-15 ENCOUNTER — Ambulatory Visit: Admitting: Internal Medicine

## 2024-04-15 ENCOUNTER — Ambulatory Visit

## 2024-04-15 VITALS — BP 153/84 | HR 78 | Temp 98.7°F | Ht 64.0 in | Wt 167.6 lb

## 2024-04-15 DIAGNOSIS — J9811 Atelectasis: Secondary | ICD-10-CM | POA: Diagnosis not present

## 2024-04-15 DIAGNOSIS — R590 Localized enlarged lymph nodes: Secondary | ICD-10-CM

## 2024-04-15 NOTE — Assessment & Plan Note (Signed)
 Patient with stable mediastinal and hilar lymphadenopathy.  Had an EBUS in 2022 which was negative.  Since then imaging has been stable.  Enlarged lymph nodes could be in the setting of metal cutting.  No evidence of calcification at this time.  No pulmonary symptoms.  Plan to obtain 1 more CT scan.  Discussed with patient that probably would not need more CAT scans in the future.

## 2024-04-15 NOTE — Progress Notes (Signed)
 Subjective:   PATIENT ID: Sergio Lawson GENDER: male DOB: 1956/01/11, MRN: [id number]   HPI 68 year old male with a past medical history of chronic hepatitis B who is presenting to the pulmonary clinic for follow-up for an abnormality on his CT chest.  Patient was previously followed by Dr. Gladis.  He was noted to have some right hilar lymphadenopathy and mediastinal lymphadenopathy as well as right middle lobe bronchus narrowing with occasional atelectasis of his right middle lobe.  Patient denies any pulmonary symptoms at this time including coughing, shortness of breath, fevers, chills, night sweats.  He denies smoking cigarettes although in his chart it mentions at the end pack-year smoking history remotely.  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  He moved from [place] to [place] and 1983.  He denies any allergies.  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HCC)      Family History  Problem Relation Age of Onset   Cancer Mother      Social History   Socioeconomic History   Marital status: Single    Spouse name: Not on file   Number of children: Not on file   Years of education: Not on file   Highest education level: GED or equivalent  Occupational History   Not on file  Tobacco Use   Smoking status: Former    Current packs/day: 0.00    Average packs/day: 0.5 packs/day for 10.0 years (5.0 ttl pk-yrs)    Types: Cigarettes    Start date: 07/26/1999    Quit date: 07/25/2009    Years since quitting: 14.7   Smokeless tobacco: Never  Vaping Use   Vaping status: Never Used  Substance and Sexual Activity   Alcohol use: Yes    Alcohol/week: 1.0 standard drink of alcohol    Types: 1 Cans of beer per week   Drug use: Never   Sexual activity: Not on file  Other Topics Concern   Not on file  Social History Narrative   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 Married - wife previously worked in a [employer] but is retired. Both he and his wife moved here from [place] in 1980 sponsored by [employer] - they still have family in [place] and take trips there. Enjoys gardening with his wife.   Social Drivers of Corporate investment banker Strain: Low Risk  (03/13/2024)   Overall Financial Resource Strain (CARDIA)    Difficulty of Paying Living Expenses: Not hard at all  Food Insecurity: No Food Insecurity (03/13/2024)   Hunger Vital Sign    Worried About Running Out of Food in the Last Year: Never true    Ran Out of Food in the Last Year: Never true  Transportation Needs: No Transportation Needs (03/13/2024)   PRAPARE - Administrator, Civil Service (Medical): No    Lack of Transportation (Non-Medical): No  Physical Activity: Sufficiently Active (03/13/2024)   Exercise Vital Sign    Days of Exercise per Week: 7 days    Minutes of Exercise per Session: 40 min  Stress: No Stress Concern Present (03/13/2024)   [employer] of Occupational Health - Occupational Stress Questionnaire    Feeling of Stress: Not at all  Social Connections: Socially Integrated (03/13/2024)   Social Connection and Isolation Panel  Frequency of Communication with Friends and Family: More Derrian three times a week    Frequency of Social Gatherings with Friends and Family: More Noboru three times a week    Attends Religious Services: 1 to 4 times per year    Active Member of [employer] or Organizations: No    Attends Engineer, structural: More Tirrell 4 times per year    Marital Status: Married  Catering manager Violence: Not At Risk (03/13/2024)   Humiliation, Afraid, Rape, and Kick questionnaire    Fear of Current or Ex-Partner: No    Emotionally Abused: No    Physically Abused: No    Sexually Abused: No     Allergies  Allergen Reactions   Codeine Nausea Only    Levaquin [Levofloxacin] Nausea Only     Outpatient Medications Prior to Visit  Medication Sig Dispense Refill   amLODipine  (NORVASC ) 5 MG tablet Take 1 tablet (5 mg total) by mouth daily. 90 tablet 0   olmesartan  (BENICAR ) 40 MG tablet Take 1 tablet (40 mg total) by mouth daily. 90 tablet 0   pantoprazole  (PROTONIX ) 40 MG tablet TAKE 1 TABLET BY MOUTH EVERY DAY 90 tablet 1   rosuvastatin  (CRESTOR ) 5 MG tablet Take 1 tablet (5 mg total) by mouth daily. 90 tablet 1   No facility-administered medications prior to visit.    ROS Reviewed all systems and reported negative except as above     Objective:   Vitals:   04/15/24 0859  BP: (!) 153/84  Pulse: 78  Temp: 98.7 F (37.1 C)  TempSrc: Temporal  SpO2: 97%  Weight: 167 lb 9.6 oz (76 kg)  Height: 5' 4 (1.626 m)    Physical Exam General: Elderly male, well-appearing not in acute distress Chest: Clear to auscultation bilaterally Heart: Regular rate and rhythm, normal S1 and S2 Abdomen: Soft, nontender Extremities well-perfused, no edema Neuro: Grossly intact    CBC    Component Value Date/Time   WBC 5.9 01/22/2024 0935   RBC 5.75 01/22/2024 0935   HGB 14.2 01/22/2024 0935   HCT 46.8 01/22/2024 0935   PLT 180 01/22/2024 0935   MCV 81.4 01/22/2024 0935   MCH 24.7 (L) 01/22/2024 0935   MCHC 30.3 (L) 01/22/2024 0935   RDW 14.8 01/22/2024 0935   LYMPHSABS 1.1 09/16/2021 1508   MONOABS 0.6 09/16/2021 1508   EOSABS 271 01/22/2024 0935   BASOSABS 30 01/22/2024 0935     Chest imaging: I reviewed his CT chest performed on 04/12/2023.  Stable hilar and mediastinal lymphadenopathy.  Right middle lobe airway with focal narrowing.   I reviewed his bronchoscopy report from 2022.  At that time he had a bronchoscopy which showed extrinsic compression of the right middle lobe entrance.  EBUS was performed at that time from his hilar and midsternal lymph nodes with negative results for malignancy.  PFT:    Latest Ref Rng &  Units 05/24/2021    2:46 PM  PFT Results  FVC-Pre L 2.12   FVC-Post L 2.21   Pre FEV1/FVC % % 77   Post FEV1/FCV % % 77   FEV1-Pre L 1.63   FEV1-Post L 1.70   DLCO uncorrected ml/min/mmHg 16.96   DLCO UNC% % 77   DLCO corrected ml/min/mmHg 17.20   DLCO COR %Predicted % 78   DLVA Predicted % 124   TLC L 6.58   TLC % Predicted % 113   RV % Predicted % 211    PFTs with nonspecific pattern  slightly reduced FEV1 and FVC.  Preserved ratio.  Normal lung volumes.  Borderline normal DLCO.        Assessment & Plan:   Assessment & Plan Mediastinal lymphadenopathy Patient with stable mediastinal and hilar lymphadenopathy.  Had an EBUS in 2022 which was negative.  Since then imaging has been stable.  Enlarged lymph nodes could be in the setting of metal cutting.  No evidence of calcification at this time.  No pulmonary symptoms.  Plan to obtain 1 more CT scan.  Discussed with patient that probably would not need more CAT scans in the future. Atelectasis Right middle lobe extrinsic compression likely related to enlarged lymph node.  Patient is currently asymptomatic without dyspnea or any symptoms.  Likely right middle lobe atelectasis is only intermittent.  He has not had any pulmonary infections from this  Orders Placed This Encounter  Procedures   CT Chest Wo Contrast    Standing Status:   Future    Expiration Date:   04/15/2025    Preferred imaging location?:   [facility]      Zola Herter, MD  Pulmonary & Critical Care Office: [phone]

## 2024-04-15 NOTE — Patient Instructions (Addendum)
 It was nice meeting you in the clinic today.  You are being evaluated because of an enlarged lymph node in the chest, this lymph node has been stable for at least 2 years now.  You also have some narrowing in the entrance of one of your right lung lobes.  You had a bronchoscopy in 2022 which showed that there was no nodules or masses inside the airway.  There might be some compression from an outside lymph node.  Your lymph nodes were biopsied in the past and were negative for cancer too.  We will get 1 more CAT scan of the chest to evaluate your lungs 1 more time.  If this CAT scan looks stable then we will likely not need more CAT scans if you   I will see you back in 1 year

## 2024-04-18 ENCOUNTER — Ambulatory Visit: Admitting: Internal Medicine

## 2024-04-19 ENCOUNTER — Ambulatory Visit
Admission: RE | Admit: 2024-04-19 | Discharge: 2024-04-19 | Disposition: A | Discharge status: Home / Self Care | Source: Ambulatory Visit

## 2024-04-19 DIAGNOSIS — R590 Localized enlarged lymph nodes: Secondary | ICD-10-CM

## 2024-04-19 DIAGNOSIS — J9811 Atelectasis: Secondary | ICD-10-CM | POA: Diagnosis not present

## 2024-04-29 ENCOUNTER — Other Ambulatory Visit

## 2024-04-29 DIAGNOSIS — Z1159 Encounter for screening for other viral diseases: Secondary | ICD-10-CM

## 2024-04-29 DIAGNOSIS — Z9189 Other specified personal risk factors, not elsewhere classified: Secondary | ICD-10-CM | POA: Diagnosis not present

## 2024-04-29 DIAGNOSIS — E785 Hyperlipidemia, unspecified: Secondary | ICD-10-CM

## 2024-04-30 ENCOUNTER — Ambulatory Visit: Payer: Self-pay | Admitting: Internal Medicine

## 2024-04-30 LAB — HEPATIC FUNCTION PANEL
AG Ratio: 1 (calc) (ref 1.0–2.5)
ALT: 20 U/L (ref 9–46)
AST: 40 U/L — ABNORMAL HIGH (ref 10–35)
Albumin: 4.1 g/dL (ref 3.6–5.1)
Alkaline phosphatase (APISO): 101 U/L (ref 35–144)
Bilirubin, Direct: 0.1 mg/dL (ref 0.0–0.2)
Globulin: 4 g/dL — ABNORMAL HIGH (ref 1.9–3.7)
Indirect Bilirubin: 0.4 mg/dL (ref 0.2–1.2)
Total Bilirubin: 0.5 mg/dL (ref 0.2–1.2)
Total Protein: 8.1 g/dL (ref 6.1–8.1)

## 2024-04-30 LAB — LIPID PANEL
Cholesterol: 207 mg/dL — ABNORMAL HIGH (ref <200)
HDL: 57 mg/dL (ref >=40)
LDL Cholesterol (Calc): 133 mg/dL — ABNORMAL HIGH
Non-HDL Cholesterol (Calc): 150 mg/dL — ABNORMAL HIGH (ref <130)
Total CHOL/HDL Ratio: 3.6 (calc) (ref <5.0)
Triglycerides: 71 mg/dL (ref <150)

## 2024-04-30 LAB — HEPATITIS C ANTIBODY: Hepatitis C Ab: NONREACTIVE

## 2024-04-30 NOTE — Progress Notes (Signed)
 Patient Care Team: Sergio Sergio PARAS, MD as PCP - General (Internal Medicine)  Visit Date: 05/02/24  Subjective:    Patient ID: Sergio Lawson , Male   DOB: Aug 18, 1955, 68 y.o.    MRN: [id number]   68 y.o. Male presents today for follow up on hypertension.  He was seen here in June and blood pressure was elevated at 160/100.  In July, he was here for a cough and diagnosed with esophagitis.  Blood pressure was elevated at 170/100.  In August, blood pressure was 160/100.  He is being treated with Benicar .  Initially dose was 20 mg daily and is now 40 mg daily.  In September, amlodipine  was added to Benicar .  Blood pressure today is excellent at 130/80.  He was seen at [facility] for mediastinal lymphadenopathy follow-up September 22 and just question with history of right hilar lymphadenopathy and mediastinal lymphadenopathy.  He is retired.  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  Has annual CT of chest without contrast.  This was last done on September 26 showing complete consolidation and collapse of the right middle lobe with abrupt cut off of the right middle lobe bronchus present since 2022 without significant change.  Did have bronchoscopy in March 2023.  Has also had PET scan in February 2023.  PET scan showed persistent complete atelectasis of right middle lobe.  There is a central right middle lobe nodule that is hypermetabolic.Continues follow up with Pulmonary.  Former smoker.  Smoked 1/2 pack/day for 10 years.  Patient reportedly quit smoking in 2011.  History of Hyperlipidemia treated with rosuvastatin  5 mg. CHOL 207, HDL 57, HDL 57, Triglycerides 71, LDL 133.     History of GE Reflux treated with Pantoprazole  40 mg.   Vaccine Counseling: Shingles vaccine second dose due.     Health maintenance: Colonoscopy discussed.     Labs 04/29/2024 Globulin 4.0, AST 40, CHOL 207, LDL 133, Otherwise WNL.   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 Married - wife previously worked in a [employer] but is retired. Both he and his wife moved here from [place] in 1980 sponsored by [employer] - they still have family in [place] and take trips there. Enjoys gardening with his wife.      Review of Systems  All other systems reviewed and are negative.       Objective:   Vitals: BP 130/80   Pulse 74   Ht 5' 4 (1.626 m)   Wt 167 lb (75.8 kg)   SpO2 97%   BMI 28.67 kg/m    Physical Exam Constitutional:      General: He is not in acute distress.    Appearance: Normal appearance. He is not ill-appearing.  HENT:     Head: Normocephalic and atraumatic.  Neck:     Vascular: No carotid bruit.  Cardiovascular:     Rate and Rhythm: Normal rate and regular rhythm.     Pulses: Normal pulses.     Heart sounds: Normal heart sounds. No murmur heard.    No friction  rub. No gallop.  Pulmonary:     Effort: Pulmonary effort is normal. No respiratory distress.     Breath sounds: Normal breath sounds. No wheezing or rales.  Skin:    General: Skin is warm and dry.  Neurological:     Mental Status: He is alert and oriented to person, place, and time. Mental status is at baseline.  Psychiatric:        Mood and Affect: Mood normal.        Behavior: Behavior normal.        Thought Content: Thought content normal.        Judgment: Judgment normal.       Results:      Labs:       Component Value Date/Time   NA 138 01/22/2024 0935   K 4.4 01/22/2024 0935   CL 102 01/22/2024 0935   CO2 26 01/22/2024 0935   GLUCOSE 92 01/22/2024 0935   BUN 14 01/22/2024 0935   CREATININE 0.86  01/22/2024 0935   CALCIUM  8.9 01/22/2024 0935   PROT 8.1 04/29/2024 0906   AST 40 (H) 04/29/2024 0906   ALT 20 04/29/2024 0906   BILITOT 0.5 04/29/2024 0906     Lab Results  Component Value Date   WBC 5.9 01/22/2024   HGB 14.2 01/22/2024   HCT 46.8 01/22/2024   MCV 81.4 01/22/2024   PLT 180 01/22/2024    Lab Results  Component Value Date   CHOL 207 (H) 04/29/2024   HDL 57 04/29/2024   LDLCALC 133 (H) 04/29/2024   TRIG 71 04/29/2024   CHOLHDL 3.6 04/29/2024      Lab Results  Component Value Date   TSH 1.55 04/27/2021     Lab Results  Component Value Date   PSA 1.72 01/22/2024   PSA 1.68 04/27/2021          Assessment & Plan:   Hypertension: treated with Olmesartan  40 mg daily, Amlodipine  5 mg daily. His last visit was 04/04/2024 where he was prescribed amlodipine  5 mg to take alongside Olmesartan  40 mg. His 04/04/2024 Blood pressure was hypertensive at 180/98. His blood pressure today is normal at 130/80.   Hyperlipidemia: treated with rosuvastatin  5 mg. CHOL 207, HDL 57, HDL 57, Triglycerides 71, LDL 133.   Rosuvastatin  5 mg increased to 10 mg.  Would like to see LDL less [place] 100  GE Reflux: treated with Pantoprazole  40 mg.   Vaccine Counseling: Zoster vaccine second dose due.   May obtain at pharmacy.  Health maintenance: Colonoscopy discussed.  Patient to consider this procedure.   Plan: Patient will return for follow-up spring 2026 (April).  His Medicare wellness visit is due after June 26,2026  Sergio Lawson,Sergio Lawson,acting as a scribe for Sergio JINNY Hailstone, MD.,have documented all relevant documentation on the behalf of Sergio JINNY Hailstone, MD,as directed by  Sergio JINNY Hailstone, MD while in the presence of Sergio JINNY Hailstone, MD.   Sergio Lawson, Sergio JINNY Hailstone, MD, have reviewed all documentation for this visit. The documentation on 05/02/2024 for the exam, diagnosis, procedures, and orders are all accurate and complete.

## 2024-05-02 ENCOUNTER — Ambulatory Visit: Admitting: Internal Medicine

## 2024-05-02 ENCOUNTER — Encounter: Payer: Self-pay | Admitting: Internal Medicine

## 2024-05-02 VITALS — BP 130/80 | HR 74 | Ht 64.0 in | Wt 167.0 lb

## 2024-05-02 DIAGNOSIS — I10 Essential (primary) hypertension: Secondary | ICD-10-CM | POA: Diagnosis not present

## 2024-05-02 DIAGNOSIS — Z87891 Personal history of nicotine dependence: Secondary | ICD-10-CM

## 2024-05-02 DIAGNOSIS — E7800 Pure hypercholesterolemia, unspecified: Secondary | ICD-10-CM | POA: Diagnosis not present

## 2024-05-02 DIAGNOSIS — R590 Localized enlarged lymph nodes: Secondary | ICD-10-CM

## 2024-05-02 MED ORDER — ROSUVASTATIN CALCIUM 10 MG PO TABS: 10.0000 mg | ORAL_TABLET | Freq: Every day | ORAL | 3 refills | Status: AC

## 2024-05-11 ENCOUNTER — Other Ambulatory Visit: Payer: Self-pay | Admitting: Internal Medicine

## 2024-05-12 NOTE — Patient Instructions (Addendum)
 Continue amlodipine  and Benicar .  Blood pressure is excellent today.  Continue rosuvastatin  for hyperlipidemia.  Continue pantoprazole  for GE reflux symptoms.  Return in 6 months for follow-up on these issues.  Medicare wellness visit is due after January 17, 2025

## 2024-05-13 ENCOUNTER — Other Ambulatory Visit: Payer: Self-pay

## 2024-06-13 ENCOUNTER — Other Ambulatory Visit: Payer: Self-pay | Admitting: Internal Medicine

## 2024-06-13 DIAGNOSIS — I10 Essential (primary) hypertension: Secondary | ICD-10-CM

## 2024-06-13 MED ORDER — OLMESARTAN MEDOXOMIL 40 MG PO TABS: 40.0000 mg | ORAL_TABLET | Freq: Every day | ORAL | 0 refills | Status: AC

## 2024-06-13 NOTE — Telephone Encounter (Signed)
 Copied from CRM [id number]. Topic: Clinical - Medication Refill >> Jun 13, 2024  3:10 PM Shardie S wrote: Medication: olmesartan  (BENICAR ) 40 MG tablet  Has the patient contacted their pharmacy? Yes (Agent: If no, request that the patient contact the pharmacy for the refill. If patient does not wish to contact the pharmacy document the reason why and proceed with request.) (Agent: If yes, when and what did the pharmacy advise?)  This is the patient's preferred pharmacy:  [id number] [place], [place] - [address] [place] [address] Phone: [phone] Fax: [phone]  Is this the correct pharmacy for this prescription? Yes If no, delete pharmacy and type the correct one.   Has the prescription been filled recently? No  Is the patient out of the medication? Yes  Has the patient been seen for an appointment in the last year OR does the patient have an upcoming appointment? Yes  Can we respond through MyChart? No  Agent: Please be advised that Rx refills may take up to 3 business days. We ask that you follow-up with your pharmacy.

## 2024-07-04 NOTE — Telephone Encounter (Signed)
 done

## 2024-07-17 ENCOUNTER — Other Ambulatory Visit: Payer: Self-pay | Admitting: Internal Medicine

## 2024-08-08 ENCOUNTER — Other Ambulatory Visit: Payer: Self-pay | Admitting: Internal Medicine

## 2024-10-31 ENCOUNTER — Other Ambulatory Visit: Payer: Self-pay

## 2024-11-01 ENCOUNTER — Ambulatory Visit: Payer: Self-pay | Admitting: Internal Medicine
